# Patient Record
Sex: Male | Born: 1965 | Race: Black or African American | Hispanic: No | Marital: Single | Smoking: Former smoker
Health system: Southern US, Community
[De-identification: ages and names within clinical notes are randomized; demographics above are authoritative.]

## PROBLEM LIST (undated history)

## (undated) DIAGNOSIS — I10 Essential (primary) hypertension: Secondary | ICD-10-CM

## (undated) DIAGNOSIS — E119 Type 2 diabetes mellitus without complications: Secondary | ICD-10-CM

## (undated) DIAGNOSIS — G43909 Migraine, unspecified, not intractable, without status migrainosus: Secondary | ICD-10-CM

## (undated) DIAGNOSIS — F32A Depression, unspecified: Secondary | ICD-10-CM

## (undated) DIAGNOSIS — F329 Major depressive disorder, single episode, unspecified: Secondary | ICD-10-CM

## (undated) DIAGNOSIS — F419 Anxiety disorder, unspecified: Secondary | ICD-10-CM

## (undated) HISTORY — DX: Type 2 diabetes mellitus without complications: E11.9

---

## 1998-03-12 ENCOUNTER — Emergency Department (HOSPITAL_COMMUNITY): Admission: EM | Admit: 1998-03-12 | Discharge: 1998-03-12 | Payer: Self-pay | Admitting: Emergency Medicine

## 1998-12-07 ENCOUNTER — Encounter: Payer: Self-pay | Admitting: Emergency Medicine

## 1998-12-07 ENCOUNTER — Emergency Department (HOSPITAL_COMMUNITY): Admission: EM | Admit: 1998-12-07 | Discharge: 1998-12-07 | Payer: Self-pay | Admitting: Emergency Medicine

## 2001-08-23 ENCOUNTER — Emergency Department (HOSPITAL_COMMUNITY): Admission: EM | Admit: 2001-08-23 | Discharge: 2001-08-23 | Payer: Self-pay | Admitting: Emergency Medicine

## 2002-05-05 ENCOUNTER — Emergency Department (HOSPITAL_COMMUNITY): Admission: EM | Admit: 2002-05-05 | Discharge: 2002-05-05 | Payer: Self-pay | Admitting: Emergency Medicine

## 2002-11-01 ENCOUNTER — Emergency Department (HOSPITAL_COMMUNITY): Admission: EM | Admit: 2002-11-01 | Discharge: 2002-11-01 | Payer: Self-pay | Admitting: Emergency Medicine

## 2002-11-01 ENCOUNTER — Encounter: Payer: Self-pay | Admitting: Emergency Medicine

## 2003-01-15 ENCOUNTER — Emergency Department (HOSPITAL_COMMUNITY): Admission: EM | Admit: 2003-01-15 | Discharge: 2003-01-15 | Payer: Self-pay | Admitting: Emergency Medicine

## 2003-01-21 ENCOUNTER — Emergency Department (HOSPITAL_COMMUNITY): Admission: EM | Admit: 2003-01-21 | Discharge: 2003-01-21 | Payer: Self-pay | Admitting: Emergency Medicine

## 2003-03-07 ENCOUNTER — Emergency Department (HOSPITAL_COMMUNITY): Admission: EM | Admit: 2003-03-07 | Discharge: 2003-03-07 | Payer: Self-pay | Admitting: Emergency Medicine

## 2003-04-26 ENCOUNTER — Emergency Department (HOSPITAL_COMMUNITY): Admission: EM | Admit: 2003-04-26 | Discharge: 2003-04-27 | Payer: Self-pay | Admitting: Emergency Medicine

## 2003-04-27 ENCOUNTER — Encounter: Payer: Self-pay | Admitting: Emergency Medicine

## 2006-03-31 ENCOUNTER — Emergency Department (HOSPITAL_COMMUNITY): Admission: EM | Admit: 2006-03-31 | Discharge: 2006-03-31 | Payer: Self-pay | Admitting: Emergency Medicine

## 2008-12-03 ENCOUNTER — Ambulatory Visit: Payer: Self-pay | Admitting: Family Medicine

## 2008-12-07 ENCOUNTER — Ambulatory Visit (HOSPITAL_COMMUNITY): Admission: RE | Admit: 2008-12-07 | Discharge: 2008-12-07 | Payer: Self-pay | Admitting: Family Medicine

## 2013-10-31 ENCOUNTER — Emergency Department (INDEPENDENT_AMBULATORY_CARE_PROVIDER_SITE_OTHER)
Admission: EM | Admit: 2013-10-31 | Discharge: 2013-10-31 | Disposition: A | Discharge status: Home / Self Care | Payer: 59 | Source: Home / Self Care

## 2013-10-31 ENCOUNTER — Encounter (HOSPITAL_COMMUNITY): Payer: Self-pay | Admitting: Emergency Medicine

## 2013-10-31 ENCOUNTER — Emergency Department (INDEPENDENT_AMBULATORY_CARE_PROVIDER_SITE_OTHER): Payer: 59

## 2013-10-31 DIAGNOSIS — S92919A Unspecified fracture of unspecified toe(s), initial encounter for closed fracture: Secondary | ICD-10-CM

## 2013-10-31 DIAGNOSIS — S92912A Unspecified fracture of left toe(s), initial encounter for closed fracture: Secondary | ICD-10-CM

## 2013-10-31 NOTE — Discharge Instructions (Signed)
Buddy Taping of Toes We have taped your toes together to keep them from moving. This is called "buddy taping" since we used a part of your own body to keep the injured part still. We placed soft padding between your toes to keep them from rubbing against each other. Buddy taping will help with healing and to reduce pain. Keep your toes buddy taped together for as long as directed by your caregiver. HOME CARE INSTRUCTIONS   Raise your injured area above the level of your heart while sitting or lying down. Prop it up with pillows.  An ice pack used every twenty minutes, while awake, for the first one to two days may be helpful. Put ice in a plastic bag and put a towel between the bag and your skin.  Watch for signs that the taping is too tight. These signs may be:  Numbness of your taped toes.  Coolness of your taped toes.  Color change in the area beyond the tape.  Increased pain.  If you have any of these signs, loosen or rewrap the tape. If you need to loosen or rewrap the buddy tape, make sure you use the padding again. SEEK IMMEDIATE MEDICAL CARE IF:   You have worse pain, swelling, inflammation (soreness), drainage or bleeding after you rewrap the tape.  Any new problems occur. MAKE SURE YOU:   Understand these instructions.  Will watch your condition.  Will get help right away if you are not doing well or get worse. Document Released: 05/18/2004 Document Revised: 11/06/2011 Document Reviewed: 08/11/2008 [facility] Patient Information 2014 [place].  Toe Fracture Your caregiver has diagnosed you as having a fractured toe. A toe fracture is a break in the bone of a toe. "Buddy taping" is a way of splinting your broken toe, by taping the broken toe to the toe next to it. This "buddy taping" will keep the injured toe from moving beyond normal range of motion. Buddy taping also helps the toe heal in a more normal alignment. It may take 6 to 8 weeks for the toe injury to  heal. [place] your toes taped together for as long as directed by your caregiver or until you see a doctor for a follow-up examination. You can change the tape after bathing. Always use a small piece of gauze or cotton between the toes when taping them together. This will help the skin stay dry and prevent infection.  Apply ice to the injury for 15-20 minutes each hour while awake for the first 2 days. Put the ice in a plastic bag and place a towel between the bag of ice and your skin.  After the first 2 days, apply heat to the injured area. Use heat for the next 2 to 3 days. Place a heating pad on the foot or soak the foot in warm water as directed by your caregiver.  Keep your foot elevated as much as possible to lessen swelling.  Wear sturdy, supportive shoes. The shoes should not pinch the toes or fit tightly against the toes.  Your caregiver may prescribe a rigid shoe if your foot is very swollen.  Your may be given crutches if the pain is too great and it hurts too much to walk.  Only take over-the-counter or prescription medicines for pain, discomfort, or fever as directed by your caregiver.  If your caregiver has given you a follow-up appointment, it is very important to keep that appointment. Not keeping the appointment  could result in a chronic or permanent injury, pain, and disability. If there is any problem keeping the appointment, you must call back to this facility for assistance. SEEK MEDICAL CARE IF:   You have increased pain or swelling, not relieved with medications.  The pain does not get better after 1 week.  Your injured toe is cold when the others are warm. SEEK IMMEDIATE MEDICAL CARE IF:   The toe becomes cold, numb, or white.  The toe becomes hot (inflamed) and red. Document Released: 08/11/2000 Document Revised: 11/06/2011 Document Reviewed: 03/30/2008 [facility] Patient Information 2014 [place].    Keep toe taped at all  times. Ok to re wrap after bathing. May ice as needed for 20 min. Use Tylenol or Motrin as directed for any discomfort. Wear post op shoe at all times when not in hard sole shoes for 6 weeks.

## 2013-10-31 NOTE — ED Provider Notes (Signed)
CSN: [id number]     Arrival date & time 10/31/13  1836 History   None    Chief Complaint  Patient presents with  . Foot Pain   (Consider location/radiation/quality/duration/timing/severity/associated sxs/prior Treatment) HPI Comments: Patient presents with left foot pain. He reports dropping a muffler on his foot. He had steel toe boots, but the part hit just below this. He has pain just below the 5th toe, and has noted bruising and swelling. Able to bear weight. No numbness or tingling.   Patient is a 48 y.o. male presenting with lower extremity pain. The history is provided by the patient.  Foot Pain    History reviewed. No pertinent past medical history. History reviewed. No pertinent past surgical history. No family history on file. History  Substance Use Topics  . Smoking status: Not on file  . Smokeless tobacco: Not on file  . Alcohol Use: Not on file    Review of Systems  All other systems reviewed and are negative.    Allergies  Review of patient's allergies indicates no known allergies.  Home Medications  No current outpatient prescriptions on file. BP 130/90  Pulse 93  Temp(Src) 97.2 F (36.2 C) (Oral)  Resp 16  SpO2 96% Physical Exam  Nursing note and vitals reviewed. Constitutional: He is oriented to person, place, and time. He appears well-developed and well-nourished. No distress.  HENT:  Head: Normocephalic and atraumatic.  Pulmonary/Chest: Effort normal.  Musculoskeletal: Normal range of motion. He exhibits edema and tenderness.  Slight ecchymosis of the base of the left 5th toe. Pain with palpation along the head of the 5th MTP. Pulses intact, good strength  Neurological: He is alert and oriented to person, place, and time.  Skin: Skin is warm and dry.  Psychiatric: His behavior is normal.    ED Course  Procedures (including critical care time) Labs Review Labs Reviewed - No data to display Imaging Review Dg Foot Complete Left  10/31/2013    CLINICAL DATA:  Dropped heavy object on foot.  Lateral foot pain.  EXAM: LEFT FOOT - COMPLETE 3+ VIEW  COMPARISON:  None.  FINDINGS: There is a mildly displaced fracture through the midshaft of the left fifth proximal phalanx. Slight lateral displacement of the distal fragment. No additional acute bony abnormality. No intra-articular extension.  IMPRESSION: Mildly displaced fracture through the left fifth proximal phalanx midshaft.   Electronically Signed   By: Charlett NoseKevin  Dover M.D.   On: 10/31/2013 20:19     MDM   1. Closed fracture of left toe   Discussed with Dr. Minda DittoKellar. Buddy tape toe with post op wooden shoe x 6 weeks. F/U if worsens.     Riki SheerMichelle G Young, PA-C 10/31/13 2042

## 2013-10-31 NOTE — ED Notes (Signed)
Patient states dropped a muffler on top of his left foot Hit right near his pinky toe

## 2013-10-31 NOTE — ED Provider Notes (Signed)
Medical screening examination/treatment/procedure(s) were performed by non-physician practitioner and as supervising physician I was immediately available for consultation/collaboration.  Leslee Homeavid Aymar Whitfill, M.D.   Reuben Likesavid C Arelis Neumeier, MD 10/31/13 2222

## 2014-02-17 ENCOUNTER — Emergency Department (HOSPITAL_COMMUNITY)
Admission: EM | Admit: 2014-02-17 | Discharge: 2014-02-17 | Disposition: A | Discharge status: Home / Self Care | Payer: Self-pay | Attending: Emergency Medicine | Admitting: Emergency Medicine

## 2014-02-17 ENCOUNTER — Encounter (HOSPITAL_COMMUNITY): Payer: Self-pay | Admitting: Emergency Medicine

## 2014-02-17 DIAGNOSIS — I159 Secondary hypertension, unspecified: Secondary | ICD-10-CM

## 2014-02-17 DIAGNOSIS — Z87891 Personal history of nicotine dependence: Secondary | ICD-10-CM | POA: Insufficient documentation

## 2014-02-17 DIAGNOSIS — R51 Headache: Secondary | ICD-10-CM | POA: Insufficient documentation

## 2014-02-17 DIAGNOSIS — I158 Other secondary hypertension: Secondary | ICD-10-CM | POA: Insufficient documentation

## 2014-02-17 DIAGNOSIS — R519 Headache, unspecified: Secondary | ICD-10-CM

## 2014-02-17 HISTORY — DX: Migraine, unspecified, not intractable, without status migrainosus: G43.909

## 2014-02-17 HISTORY — DX: Essential (primary) hypertension: I10

## 2014-02-17 LAB — BASIC METABOLIC PANEL
BUN: 12 mg/dL (ref 6–23)
CO2: 27 mEq/L (ref 19–32)
Calcium: 9.3 mg/dL (ref 8.4–10.5)
Chloride: 105 mEq/L (ref 96–112)
Creatinine, Ser: 0.84 mg/dL (ref 0.50–1.35)
GFR calc Af Amer: >90 mL/min (ref >90)
Glucose, Bld: 100 mg/dL — ABNORMAL HIGH (ref 70–99)
POTASSIUM: 4.1 meq/L (ref 3.7–5.3)
SODIUM: 145 meq/L (ref 137–147)

## 2014-02-17 LAB — URINALYSIS, ROUTINE W REFLEX MICROSCOPIC
Bilirubin Urine: NEGATIVE
Glucose, UA: NEGATIVE mg/dL
HGB URINE DIPSTICK: NEGATIVE
Ketones, ur: NEGATIVE mg/dL
Leukocytes, UA: NEGATIVE
NITRITE: NEGATIVE
Protein, ur: 30 mg/dL — AB
Specific Gravity, Urine: 1.024 (ref 1.005–1.030)
UROBILINOGEN UA: 1 mg/dL (ref 0.0–1.0)
pH: 6.5 (ref 5.0–8.0)

## 2014-02-17 LAB — URINE MICROSCOPIC-ADD ON

## 2014-02-17 MED ORDER — SODIUM CHLORIDE 0.9 % IV BOLUS (SEPSIS)
1000.0000 mL | Freq: Once | INTRAVENOUS | Status: AC
  Administered 2014-02-17: 1000 mL via INTRAVENOUS

## 2014-02-17 MED ORDER — DIPHENHYDRAMINE HCL 50 MG/ML IJ SOLN
25.0000 mg | Freq: Once | INTRAMUSCULAR | Status: AC
  Administered 2014-02-17: 25 mg via INTRAVENOUS

## 2014-02-17 MED ORDER — METOCLOPRAMIDE HCL 5 MG/ML IJ SOLN
10.0000 mg | Freq: Once | INTRAMUSCULAR | Status: AC
  Administered 2014-02-17: 10 mg via INTRAVENOUS

## 2014-02-17 NOTE — ED Provider Notes (Signed)
CSN: [id number]     Arrival date & time 02/17/14  1846 History   First MD Initiated Contact with Patient 02/17/14 1920     Chief Complaint  Patient presents with  . Hypertension  . Headache     (Consider location/radiation/quality/duration/timing/severity/associated sxs/prior Treatment) Patient is a 48 y.o. male presenting with general illness. The history is provided by the patient and the spouse.  Illness Severity:  Mild Onset quality:  Gradual Timing:  Constant Progression:  Unchanged Chronicity:  New Associated symptoms: headaches   Associated symptoms: no abdominal pain, no chest pain, no congestion, no cough, no diarrhea, no fever, no nausea, no rhinorrhea, no shortness of breath and no vomiting     48 yo male pw hypertension. H/o same. Off meds after making lifestyle changes. Self discontinued. Generalized fatigue past few days. Also with gradual onset mild (2/10 max) posterior headache. Non-radiating. No trauma. No acute visual changes. No numbness/tingling/weakness. No CP or SOB.  Checked home BP today and found it to be 210/150. Took family members atenolol. No change in symptoms. Otherwise been feeling well.   Past Medical History  Diagnosis Date  . Migraines   . Hypertension    History reviewed. No pertinent past surgical history. No family history on file. History  Substance Use Topics  . Smoking status: Former Games developermoker  . Smokeless tobacco: Not on file  . Alcohol Use: No    Review of Systems  Constitutional: Negative for fever and chills.  HENT: Negative for congestion and rhinorrhea.   Eyes: Negative for visual disturbance (wearing glassess past 4 years. ).  Respiratory: Negative for cough and shortness of breath.   Cardiovascular: Negative for chest pain and leg swelling.  Gastrointestinal: Negative for nausea, vomiting, abdominal pain and diarrhea.  Genitourinary: Negative for flank pain and difficulty urinating.  Musculoskeletal: Negative for back pain  and neck stiffness.  Neurological: Positive for light-headedness (mild. resolved) and headaches. Negative for dizziness.  All other systems reviewed and are negative.     Allergies  Review of patient's allergies indicates no known allergies.  Home Medications   Prior to Admission medications   Medication Sig Start Date End Date Taking? Authorizing Provider  aspirin 325 MG tablet Take 650 mg by mouth daily as needed for moderate pain.   Yes Historical Provider, MD  Aspirin-Salicylamide-Caffeine (BC HEADACHE POWDER PO) Take 1 each by mouth 2 (two) times daily as needed (for headache).   Yes Historical Provider, MD  ibuprofen (ADVIL,MOTRIN) 200 MG tablet Take 800 mg by mouth every 6 (six) hours as needed.   Yes Historical Provider, MD  OVER THE COUNTER MEDICATION Take 2 tablets by mouth daily. Hydroxycut   Yes Historical Provider, MD   BP 163/95  Pulse 85  Temp(Src) 98.4 F (36.9 C) (Oral)  Ht 6' (1.829 m)  Wt 231 lb (104.781 kg)  BMI 31.32 kg/m2  SpO2 92% Physical Exam  Nursing note and vitals reviewed. Constitutional: He is oriented to person, place, and time. He appears well-developed and well-nourished. No distress.  Sitting up on side of bed. NAD. Smiling. Speaks in full sentences.  HENT:  Head: Normocephalic and atraumatic.  Eyes: Conjunctivae and EOM are normal. Pupils are equal, round, and reactive to light. Right eye exhibits no discharge. Left eye exhibits no discharge.  Neck: Normal range of motion. Neck supple. No tracheal deviation present.  No bruit  Cardiovascular: Normal rate, regular rhythm, normal heart sounds and intact distal pulses.   Pulmonary/Chest: Effort normal and breath sounds  normal. No stridor. No respiratory distress. He has no wheezes. He has no rales.  Abdominal: Soft. He exhibits no distension. There is no tenderness. There is no guarding.  Musculoskeletal: He exhibits no edema and no tenderness.  Neurological: He is alert and oriented to person,  place, and time. He has normal strength. No cranial nerve deficit or sensory deficit. He displays a negative Romberg sign. Coordination and gait normal. GCS eye subscore is 4. GCS verbal subscore is 5. GCS motor subscore is 6.  Intact finger to nose Normal gait Intact tandem gait No drift  Skin: Skin is warm and dry.  Psychiatric: He has a normal mood and affect. His behavior is normal.    ED Course  Procedures (including critical care time) Labs Review Labs Reviewed  BASIC METABOLIC PANEL - Abnormal; Notable for the following:    Glucose, Bld 100 (*)    All other components within normal limits  URINALYSIS, ROUTINE W REFLEX MICROSCOPIC - Abnormal; Notable for the following:    Protein, ur 30 (*)    All other components within normal limits  URINE MICROSCOPIC-ADD ON    Imaging Review No results found.   EKG Interpretation   Date/Time:  Tuesday February 17 2014 20:10:24 EDT Ventricular Rate:  75 PR Interval:  230 QRS Duration: 103 QT Interval:  406 QTC Calculation: 453 R Axis:   88 Text Interpretation:  Sinus rhythm Prolonged PR interval No old tracing to  compare Confirmed by [facility]  MD, DAVID ([id number]) on 02/17/2014 8:41:24 PM      MDM   Final diagnoses:  Secondary hypertension, unspecified  Acute nonintractable headache, unspecified headache type    HTN and headache. BP improved during ED course and was never >200 systolics. Without evidence of end organ dysfxn. Do not suspect htn emergency. Not cw CVA/TIA. No trauma. No CP. Normal Cr. Headache cocktail with resolution of headache. Will hold on initiating antihypertensives as patient's BP improved during ED course without intervention. Given resource for pcp follow up. No infectious s/s. No meningismus. Mild ha and not acute. Do not suspect SAH.   Patient discharged home. Return precautions given. To follow up with pcp. patient in agreement with plan.  Labs and imaging reviewed by myself and considered in medical decision  making if ordered. Imaging interpreted by radiology.   Discussed case with Dr. Preston FleetingGlick who is in agreement with assessment and plan.      Stevie Kernyan Hoang Pettingill, MD 02/18/14 [phone]

## 2014-02-17 NOTE — ED Notes (Signed)
PT states he had headache and sister checked blood pressure and it was 210/150.  Pt reports weakness all over and tired.  No numbess

## 2014-02-17 NOTE — ED Notes (Signed)
Pt presents w/ HA x4 days, admits to hx of migraines, states he has also been off his blood pressure medication "for a while" and took his BP w/ his sisters machine and his BP was elevated today. Pt reports improvement in headache since being out of the sun. Pt resting comfortably on bed in no acute distress, family at bedside.

## 2014-02-17 NOTE — ED Provider Notes (Signed)
48 year old male had been diagnosed with high blood pressure 7 years ago and was given a one-month course of antihypertensive medication but he never followed up with a physician. He has been monitoring his blood pressure intermittently since then and had been doing generally well until 3 days ago when he noted a blood pressure of 210/150. Today, he started having a mild occipital headache and recheck his blood pressure was in the same range so he came in to be evaluated. On exam, he is resting comfortably and in no acute distress. Blood pressure in the ED is only moderately elevated at 165/95. His fundi show no hemorrhage, exudate, or papilledema. Neck is supple with mild tenderness to palpation in the insertion of the paracervical muscles bilaterally. Lungs are clear heart is regular rate and rhythm. Extremities have no cyanosis or edema. Neurologic exam is normal. He probably does have mild hypertension but I see no evidence of hypertensive urgency. He will be given headache cocktail for his headache and screening labs to be obtained to evaluate for evidence of endorgan damage. You will need to have his blood pressure monitored as an outpatient on a more regular basis.  I saw and evaluated the patient, reviewed the resident's note and I agree with the findings and plan.   EKG Interpretation   Date/Time:  Tuesday February 17 2014 20:10:24 EDT Ventricular Rate:  75 PR Interval:  230 QRS Duration: 103 QT Interval:  406 QTC Calculation: 453 R Axis:   88 Text Interpretation:  Sinus rhythm Prolonged PR interval No old tracing to  compare Confirmed by [facility]  MD, DAVID ([id number]) on 02/17/2014 8:41:24 PM        Dione Boozeavid Glick, MD 02/17/14 2045

## 2014-02-17 NOTE — Discharge Instructions (Signed)
°Emergency Department Resource Guide °1) Find a Doctor and Pay Out of Pocket °Although you won't have to find out who is covered by your insurance plan, it is a good idea to ask around and get recommendations. You will then need to call the office and see if the doctor you have chosen will accept you as a new patient and what types of options they offer for patients who are self-pay. Some doctors offer discounts or will set up payment plans for their patients who do not have insurance, but you will need to ask so you aren't surprised when you get to your appointment. ° °2) Contact Your Local Health Department °Not all health departments have doctors that can see patients for sick visits, but many do, so it is worth a call to see if yours does. If you don't know where your local health department is, you can check in your phone book. The CDC also has a tool to help you locate your state's health department, and many state websites also have listings of all of their local health departments. ° °3) Find a Walk-in Clinic °If your illness is not likely to be very severe or complicated, you may want to try a walk in clinic. These are popping up all over the country in pharmacies, drugstores, and shopping centers. They're usually staffed by nurse practitioners or physician assistants that have been trained to treat common illnesses and complaints. They're usually fairly quick and inexpensive. However, if you have serious medical issues or chronic medical problems, these are probably not your best option. ° °No Primary Care Doctor: °- Call [facility] at  832-8000 - they can help you locate a primary care doctor that  accepts your insurance, provides certain services, etc. °- Physician Referral Service- 1-800-533-3463 ° °Chronic Pain Problems: °Organization         Address  Phone   Notes  °[facility]  (336) 297-2271 Patients need to be referred by their primary care doctor.  ° °Medication  Assistance: °Organization         Address  Phone   Notes  °[place] [facility] [address] °[place], [place] [address] (336) 641-8030 --Must be a resident of [place] °-- Must have NO insurance coverage whatsoever (no Medicaid/ Medicare, etc.) °-- The pt. MUST have a primary care doctor that directs their care regularly and follows them in the community °  °[facility]  (866) 331-1348   °[employer]  (888) 892-1162   ° °Agencies that provide inexpensive medical care: °Organization         Address  Phone   Notes  °[facility]  (336) 832-8035   ° Internal Medicine    (336) 832-7272   °[facility] [address] °[place], [place] [address] (336) 832-4777   °[facility] [address], °[place] (336) 271-4999   °[facility]    (336) 373-0678   °[facility]    (336) 272-1050   °[facility] ° [address], [place] Phone:  (336) 832-4444, Fax:  (336) 832-4440 Hours of Operation:  9 am - 6 pm, M-F.  Also accepts Medicaid/Medicare and self-pay.  °[facility] ° [address], [place] Phone: (336) 832-3150, Fax: (336) 832-3151. Hours of Operation:  8:30 am - 5:30 pm, M-F.  Also accepts Medicaid and self-pay.  °[facility] [place] [address], [place] Phone: (336) 878-6027   °[facility] [address], [place], [place] (336)723-1848, Ext. 123 Mondays & Thursdays: 7-9 AM.  First 15 patients are seen on a first come, first serve basis. °  ° °Medicaid-accepting [place] Providers: ° °Organization         Address  Phone   Notes  °[facility] [address], [place] (336) 641-2100 Also accepts self-pay patients.  °[facility] [address], [place] ° (336) 856-9996   °[facility] [address], [place]  (336) 288-8857   °[facility] [address] [place] [address], [place] (336) 299-7000   °Veita Bland [address], [place]  ° (336) 373-1557 Only accepts [place] Access Medicaid patients after they have their name applied to their card.  ° °Self-Pay (no insurance) in [place]: ° °Organization         Address  Phone   Notes  °Sickle Cell Patients, [facility] [address], [place] (336) 832-1970   °[facility] [address], [place] (336) 832-4400   °[facility] ° [address], [place] (336) 992-4800   °[facility]/Dr. Osei-Bonsu ° [address] [place] [address], [place] or [address], [place] (336) 841-8500 Phone number for both [place] and [place] locations is the same.  °[facility] [address], [place] (336) 299-0000   °[facility] [address] [place] [address], [place] or [address] (336) 852-7530 °(336) 878-2260   °[facility] [address], [place] (336) 350-1642, phone; (336) 294-5005, fax Sees patients 1st and 3rd Saturday of every month.  Must not qualify for public or private insurance (i.e. Medicaid, Medicare, [place] Health Choice, Veterans' Benefits) • Household income should be no more than 200% of the poverty level •The clinic cannot treat you if you are pregnant or think you are pregnant • Sexually transmitted diseases are not treated at the clinic.  ° ° °Dental Care: °Organization         Address  Phone  Notes  °[place] [facility] [address], [place] (336) 641-6152 Accepts children up to age 21 who are enrolled in Medicaid or [place] Health Choice; pregnant women with a Medicaid card; and children who have applied for Medicaid or [place] Health Choice, but were declined, whose parents can pay a reduced fee at time of service.  °[place]  [facility] [place]  [address], [place] (336) 641-7733 Accepts children up to age 21 who are enrolled in Medicaid or [place] Health Choice; pregnant women with a Medicaid card; and children who have applied for Medicaid or [place] Health Choice, but were declined, whose parents can pay a reduced fee at time of service.  °[facility] ° [address],  (336) 641-4533 Patients are seen by appointment only. Walk-ins are not accepted. [facility] will see patients 18 years of age and older. °Monday - Tuesday (8am-5pm) °Most Wednesdays (8:30-5pm) °$30 per visit, cash only  °[facility] ° [address], [place] (336) 641-4533 Patients are seen by appointment only. Walk-ins are not accepted. [facility] will see patients 18 years of age and older. °One   Wednesday Evening (Monthly: Volunteer Based).  $30 per visit, cash only  °[facility]  (919) 537-3737 for adults; Children under age 4, call Graduate Pediatric Dentistry at (919) [phone]. Children aged 4-14, please call (919) [phone] to request a pediatric application. ° Dental services are provided in all areas of dental care including fillings, crowns and bridges, complete and partial dentures, implants, gum treatment, root canals, and extractions. Preventive care is also provided. Treatment is provided to both adults and children. °Patients are selected via a lottery and there is often a waiting list. °  °[facility] [address], °[place] ° (336) 763-8833 [email or web address] °  °[facility] [address], [place], [place] (336)723-1848, Ext. 123 Second and Fourth Thursday of each month, opens at 6:30 AM; Clinic ends at 9 AM.  Patients are seen on a first-come first-served basis, and a limited number are seen during each clinic.  ° °[facility] ° [address], [place], [place] (336) 723-7904    Eligibility Requirements °You must have lived in [place], [place], or [place] counties for at least the last three months. °  You cannot be eligible for state or federal sponsored healthcare insurance, including Veterans Administration, Medicaid, or Medicare. °  You generally cannot be eligible for healthcare insurance through your employer.  °  How to apply: °Eligibility screenings are held every Tuesday and Wednesday afternoon from 1:00 pm until 4:00 pm. You do not need an appointment for the interview!  °[facility] [address], [place], [place] 336-631-2330   °[facility]  336-342-8273   °[facility]  336-703-3100   °[facility]  336-570-6415   ° °Behavioral Health Resources in the Community: °Intensive Outpatient Programs °Organization         Address  Phone  Notes  °[facility] [address], [place], [place] 336-878-6098   °[facility] [address], [place], [place] 336-832-9800   °[facility] [address], [place], [place] ° 336-882-2125   °[facility] [address],  °[place], [place] 1-800-853-5163 or 336-641-4981   °Substance Abuse Resources °Organization         Address  Phone  Notes  °[facility]  336-882-2125   °[facility]  336-784-9470   °[facility]  336-285-9073   °[facility]  336-845-3988   °Residential & Outpatient Substance Abuse Program  1-800-659-3381   °Psychological Services °Organization         Address  Phone  Notes  °[facility]  336- 832-9600   °[facility]  336- 378-7881   °[facility] [address], [place] 1-800-853-5163 or 336-641-4981   ° °Mobile Crisis Teams °Organization         Address  Phone  Notes  °[facility], Mobile Crisis Care Unit  1-877-626-1772   °Assertive °[facility] ° [address]  [place], [place] 336-834-9664   °Sharon DeEsch [address] °[place] 336-554-5454   ° °Self-Help/Support Groups °Organization         Address  Phone             Notes  °[facility] [place] - variety of support groups  336- 373-1402 Call for more information  °Narcotics Anonymous (NA), [facility] [address], °[place]  2 meetings at this location  ° °  Residential Treatment Programs °Organization         Address  Phone  Notes  °[facility] [address],    °[place]  1-866-801-8205   °[facility] ° [address], [place], [place] 704-293-8524   °[facility] [address], [place] 336-845-3988 Admissions: 8am-3pm M-F  °[facility] [address]    °[place], [place] 336-841-1104   °[facility] [address], [place], [place] 336-379-7146   °[facility] [address]  °[place], [place] 336-285-9073   °[facility] - Intensive Outpatient [address], [place], [place] 336-852-3033   °[facility] [address]  °[place], [place] 1-877-615-2722 or 336-784-9470   °[facility] [address] [place], [place] 336-227-7417 Accepts Medicaid  °[facility] [address]  ° [place] 1-800-659-3381 Substance Abuse/Addiction Treatment  ° °[place] Behavioral Health Resources °Organization         Address  Phone  Notes  °[facility]  (888) 581-9988   °Julie Brannon, PhD [address] [place], [place]   (336) 349-5553 or (336) 951-0000   °[facility]   [address] °[place], [place] (336) 349-4454   °[facility] [address], [place], [place] (336) 342-8316 Insurance/Medicaid/sponsorship through [facility]  °[facility] [address]                                    [place], [place] (336) 342-8316 Therapy/tele-psych/case    °[facility] [address]  ° [place], [place] (336) 349-2233    °Dr. Arfeen  (336) 349-4544   °[facility] [place]  [employer] [place] [facility] 1) [address], [place] °2) [address], [place] °3)  [address], [place] (336) 349-3220 °(336) 342-7768 ° °(336) 342-8140   °[place] [facility] (336) 342-1394 or (336) 342-3537 (After Hours)    ° ° °

## 2014-02-17 NOTE — ED Notes (Signed)
Pt ambulating independently w/ steady gait on d/c in no acute distress, A&Ox4.D/c instructions reviewed w/ pt and family - pt and family deny any further questions or concerns at present.  

## 2014-02-17 NOTE — ED Notes (Signed)
Pt did take one of his sister's blood pressure pills today

## 2016-02-19 ENCOUNTER — Emergency Department (HOSPITAL_COMMUNITY)
Admission: EM | Admit: 2016-02-19 | Discharge: 2016-02-19 | Disposition: A | Discharge status: Home / Self Care | Payer: BLUE CROSS/BLUE SHIELD | Attending: Emergency Medicine | Admitting: Emergency Medicine

## 2016-02-19 ENCOUNTER — Encounter (HOSPITAL_COMMUNITY): Payer: Self-pay | Admitting: Emergency Medicine

## 2016-02-19 DIAGNOSIS — I10 Essential (primary) hypertension: Secondary | ICD-10-CM | POA: Diagnosis not present

## 2016-02-19 DIAGNOSIS — H109 Unspecified conjunctivitis: Secondary | ICD-10-CM | POA: Insufficient documentation

## 2016-02-19 DIAGNOSIS — Z79899 Other long term (current) drug therapy: Secondary | ICD-10-CM | POA: Diagnosis not present

## 2016-02-19 DIAGNOSIS — H5711 Ocular pain, right eye: Secondary | ICD-10-CM | POA: Diagnosis present

## 2016-02-19 DIAGNOSIS — Z87891 Personal history of nicotine dependence: Secondary | ICD-10-CM | POA: Insufficient documentation

## 2016-02-19 MED ORDER — POLYMYXIN B-TRIMETHOPRIM 10000-0.1 UNIT/ML-% OP SOLN
1.0000 [drp] | OPHTHALMIC | Status: DC
  Administered 2016-02-19: 1 [drp] via OPHTHALMIC
  Filled 2016-02-19: qty 10

## 2016-02-19 MED ORDER — FLUORESCEIN SODIUM 1 MG OP STRP
1.0000 | ORAL_STRIP | Freq: Once | OPHTHALMIC | Status: AC
  Administered 2016-02-19: 1 via OPHTHALMIC
  Filled 2016-02-19: qty 1

## 2016-02-19 MED ORDER — TETRACAINE HCL 0.5 % OP SOLN
1.0000 [drp] | Freq: Once | OPHTHALMIC | Status: AC
  Administered 2016-02-19: 1 [drp] via OPHTHALMIC
  Filled 2016-02-19: qty 4

## 2016-02-19 NOTE — ED Notes (Signed)
Pt states he went to urgent care Monday and was diagnosed with conjunctivitis. Pt states he has been taking eye drops and rubbing his eyelid constantly. Feels like something is in his eye or he scratched it, but eye drops are not giving him any relief.

## 2016-02-19 NOTE — Discharge Instructions (Signed)
How to Use Eye Drops and Eye Ointments  HOW TO APPLY EYE DROPS  Follow these steps when applying eye drops:  1. Wash your hands.  2. Tilt your head back.  3. Put a finger under your eye and use it to gently pull your lower lid downward. Keep that finger in place.  4. Using your other hand, hold the dropper between your thumb and index finger.  5. Position the dropper just over the edge of the lower lid. Hold it as close to your eye as you can without touching the dropper to your eye.  6. Steady your hand. One way to do this is to lean your index finger against your brow.  7. Look up.  8. Slowly and gently squeeze one drop of medicine into your eye.  9. Close your eye.  10. Place a finger between your lower eyelid and your nose. Press gently for 2 minutes. This increases the amount of time that the medicine is exposed to the eye. It also reduces side effects that can develop if the drop gets into the bloodstream through the nose.  HOW TO APPLY EYE OINTMENTS  Follow these steps when applying eye ointments:  1. Wash your hands.  2. Put a finger under your eye and use it to gently pull your lower lid downward. Keep that finger in place.  3. Using your other hand, place the tip of the tube between your thumb and index finger with the remaining fingers braced against your cheek or nose.  4. Hold the tube just over the edge of your lower lid without touching the tube to your lid or eyeball.  5. Look up.  6. Line the inner part of your lower lid with ointment.  7. Gently pull up on your upper lid and look down. This will force the ointment to spread over the surface of the eye.  8. Release the upper lid.  9. If you can, close your eyes for 1-2 minutes.  Do not rub your eyes. If you applied the ointment correctly, your vision will be blurry for a few minutes. This is normal.  ADDITIONAL INFORMATION   Make sure to use the eye drops or ointment as told by your health care provider.   If you have been told to use both eye  drops and an eye ointment, apply the eye drops first, then wait 3-4 minutes before you apply the ointment.   Try not to touch the tip of the dropper or tube to your eye. A dropper or tube that has touched the eye can become contaminated.     This information is not intended to replace advice given to you by your health care provider. Make sure you discuss any questions you have with your health care provider.     Document Released: 11/20/2000 Document Revised: 12/29/2014 Document Reviewed: 08/10/2014  Elsevier Interactive Patient Education 2016 Elsevier Inc[employer]

## 2016-02-19 NOTE — ED Provider Notes (Signed)
CSN: [id number]     Arrival date & time 02/19/16  1704 History  By signing my name below, I, Casey Garrett, attest that this documentation has been prepared under the direction and in the presence of Earley FavorGail Christasia Angeletti, NP. Electronically Signed: Ronney LionSuzanne Garrett, ED Scribe. 02/19/2016. 5:44 PM.    Chief Complaint  Patient presents with  . Eye Pain   The history is provided by the patient. No language interpreter was used.    HPI Comments: Casey LefevreKeith D Garrett is a 50 y.o. male with a history of hypertension and migraines, who presents to the Emergency Department complaining of right eye pain and redness that began 8 days ago. He states about 1 day afterwards, his friend had advised him to put coconut oil in his eye, which he tried and which seemed to alleviate his symptoms for several days. However, patient had gone to an UC clinic about 5 days ago just in case, and he was diagnosed with conjunctivitis. Patient states he was also told at that time that he might have a scratch in his eye. Patient was diagnosed with eyedrops,initially got better and stopped the drops for a few days than 2 days ago again had symptoms but not drops not helping. Woke this morning with crusted lids and lid swelling.  He states eye movement do not exacerbates his symptoms.  Patient states he is UTD on his tetanus vaccination. Patient denies any visual disturbances.   Past Medical History  Diagnosis Date  . Migraines   . Hypertension    History reviewed. No pertinent past surgical history. No family history on file. Social History  Substance Use Topics  . Smoking status: Former Games developermoker  . Smokeless tobacco: None  . Alcohol Use: No    Review of Systems  Eyes: Positive for pain, discharge, redness and itching. Negative for photophobia and visual disturbance.  All other systems reviewed and are negative.   Allergies  Review of patient's allergies indicates no known allergies.  Home Medications   Prior to Admission medications    Medication Sig Start Date End Date Taking? Authorizing Provider  ATORVASTATIN CALCIUM PO Take by mouth daily.   Yes Historical Provider, MD  LOSARTAN POTASSIUM PO Take by mouth.   Yes Historical Provider, MD  OMEPRAZOLE PO Take by mouth. As directed.   Yes Historical Provider, MD  trimethoprim-polymyxin b (POLYTRIM) ophthalmic solution 1 drop every 6 (six) hours. To affected eye.   Yes Historical Provider, MD   BP 156/99 mmHg  Pulse 88  Temp(Src) 98.6 F (37 C) (Oral)  Resp 18  SpO2 95% Physical Exam  Constitutional: He is oriented to person, place, and time. He appears well-developed and well-nourished. No distress.  HENT:  Head: Normocephalic and atraumatic.  Eyes: EOM are normal. Pupils are equal, round, and reactive to light. Right eye exhibits discharge. Right conjunctiva is injected.  Neck: Neck supple. No tracheal deviation present.  Cardiovascular: Normal rate.   Pulmonary/Chest: Effort normal. No respiratory distress.  Musculoskeletal: Normal range of motion.  Neurological: He is alert and oriented to person, place, and time.  Skin: Skin is warm and dry.  Psychiatric: He has a normal mood and affect. His behavior is normal.  Nursing note and vitals reviewed.   ED Course  Procedures (including critical care time)  DIAGNOSTIC STUDIES: Oxygen Saturation is 95% on RA, normal by my interpretation.    COORDINATION OF CARE: 5:44 PM - Discussed treatment plan with pt at bedside which includes fluorescein eye exam. Pt verbalized  understanding and agreed to plan.  Check the pharmacy.  He was prescribed Polytrim he will again be prescribed Polytrim.  He will be given the drops in the emergency department with instructions to use 1 drop to his right eye every 4 hours for the next 5 days and follow-up with his ophthalmologist  MDM   Final diagnoses:  Conjunctivitis of right eye     I personally performed the services described in this documentation, which was scribed in my  presence. The recorded information has been reviewed and is accurate.   Earley FavorGail Rayen Palen, NP 02/19/16 1827  Lyndal Pulleyaniel Knott, MD 02/21/16 [phone]

## 2016-02-21 ENCOUNTER — Emergency Department (HOSPITAL_COMMUNITY)
Admission: EM | Admit: 2016-02-21 | Discharge: 2016-02-21 | Disposition: A | Discharge status: Home / Self Care | Payer: BLUE CROSS/BLUE SHIELD

## 2016-07-11 ENCOUNTER — Telehealth: Payer: Self-pay | Admitting: *Deleted

## 2016-07-11 ENCOUNTER — Ambulatory Visit: Payer: BLUE CROSS/BLUE SHIELD | Admitting: Neurology

## 2016-07-11 NOTE — Telephone Encounter (Signed)
No showedpatient new  appt

## 2016-07-12 ENCOUNTER — Encounter: Payer: Self-pay | Admitting: Neurology

## 2017-02-12 ENCOUNTER — Encounter (HOSPITAL_COMMUNITY): Payer: Self-pay | Admitting: Emergency Medicine

## 2017-02-12 ENCOUNTER — Emergency Department (HOSPITAL_COMMUNITY)
Admission: EM | Admit: 2017-02-12 | Discharge: 2017-02-12 | Disposition: A | Discharge status: Home / Self Care | Payer: BLUE CROSS/BLUE SHIELD | Attending: Emergency Medicine | Admitting: Emergency Medicine

## 2017-02-12 DIAGNOSIS — Y939 Activity, unspecified: Secondary | ICD-10-CM | POA: Insufficient documentation

## 2017-02-12 DIAGNOSIS — Z79899 Other long term (current) drug therapy: Secondary | ICD-10-CM | POA: Insufficient documentation

## 2017-02-12 DIAGNOSIS — T23231A Burn of second degree of multiple right fingers (nail), not including thumb, initial encounter: Secondary | ICD-10-CM

## 2017-02-12 DIAGNOSIS — X18XXXA Contact with other hot metals, initial encounter: Secondary | ICD-10-CM | POA: Insufficient documentation

## 2017-02-12 DIAGNOSIS — Y929 Unspecified place or not applicable: Secondary | ICD-10-CM | POA: Insufficient documentation

## 2017-02-12 DIAGNOSIS — Y999 Unspecified external cause status: Secondary | ICD-10-CM | POA: Insufficient documentation

## 2017-02-12 DIAGNOSIS — Z87891 Personal history of nicotine dependence: Secondary | ICD-10-CM | POA: Insufficient documentation

## 2017-02-12 DIAGNOSIS — T23239A Burn of second degree of unspecified multiple fingers (nail), not including thumb, initial encounter: Secondary | ICD-10-CM | POA: Insufficient documentation

## 2017-02-12 DIAGNOSIS — I10 Essential (primary) hypertension: Secondary | ICD-10-CM | POA: Insufficient documentation

## 2017-02-12 HISTORY — DX: Depression, unspecified: F32.A

## 2017-02-12 HISTORY — DX: Major depressive disorder, single episode, unspecified: F32.9

## 2017-02-12 HISTORY — DX: Anxiety disorder, unspecified: F41.9

## 2017-02-12 MED ORDER — LOSARTAN POTASSIUM 50 MG PO TABS
50.0000 mg | ORAL_TABLET | Freq: Every day | ORAL | 0 refills | Status: DC
Start: 2017-02-12 — End: 2018-06-13

## 2017-02-12 MED ORDER — HYDROCODONE-ACETAMINOPHEN 5-325 MG PO TABS
1.0000 | ORAL_TABLET | Freq: Four times a day (QID) | ORAL | 0 refills | Status: DC | PRN
Start: 2017-02-12 — End: 2021-01-20

## 2017-02-12 MED ORDER — IBUPROFEN 800 MG PO TABS
800.0000 mg | ORAL_TABLET | Freq: Once | ORAL | Status: AC
  Administered 2017-02-12: 800 mg via ORAL
  Filled 2017-02-12: qty 1

## 2017-02-12 NOTE — ED Notes (Signed)
Registration remains at bedside.

## 2017-02-12 NOTE — Discharge Instructions (Signed)
As discussed, please stay well hydrated and repeat urine clear. Keep the area clean and dry and use cool compresses and ibuprofen for pain and Vicodin only as needed for breakthrough pain.  Follow-up with your primary care provider in the next few days.  Return to the emergency department if you experience any worsening symptoms or new concerning symptoms in the meantime.
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
## 2018-06-13 ENCOUNTER — Other Ambulatory Visit: Payer: Self-pay

## 2018-06-13 ENCOUNTER — Encounter (HOSPITAL_COMMUNITY): Payer: Self-pay

## 2018-06-13 ENCOUNTER — Emergency Department (HOSPITAL_COMMUNITY)
Admission: EM | Admit: 2018-06-13 | Discharge: 2018-06-13 | Disposition: A | Discharge status: Home / Self Care | Payer: BLUE CROSS/BLUE SHIELD | Attending: Emergency Medicine | Admitting: Emergency Medicine

## 2018-06-13 DIAGNOSIS — I10 Essential (primary) hypertension: Secondary | ICD-10-CM | POA: Insufficient documentation

## 2018-06-13 DIAGNOSIS — I160 Hypertensive urgency: Secondary | ICD-10-CM | POA: Insufficient documentation

## 2018-06-13 DIAGNOSIS — Z87891 Personal history of nicotine dependence: Secondary | ICD-10-CM | POA: Insufficient documentation

## 2018-06-13 DIAGNOSIS — Z79899 Other long term (current) drug therapy: Secondary | ICD-10-CM | POA: Insufficient documentation

## 2018-06-13 LAB — URINALYSIS, ROUTINE W REFLEX MICROSCOPIC
Bilirubin Urine: NEGATIVE
Glucose, UA: NEGATIVE mg/dL
HGB URINE DIPSTICK: NEGATIVE
Ketones, ur: NEGATIVE mg/dL
Leukocytes, UA: NEGATIVE
Nitrite: NEGATIVE
Protein, ur: 100 mg/dL — AB
Specific Gravity, Urine: 1.017 (ref 1.005–1.030)
pH: 7 (ref 5.0–8.0)

## 2018-06-13 LAB — I-STAT CHEM 8, ED
BUN: 10 mg/dL (ref 6–20)
CHLORIDE: 101 mmol/L (ref 98–111)
Calcium, Ion: 1.13 mmol/L — ABNORMAL LOW (ref 1.15–1.40)
Creatinine, Ser: 0.8 mg/dL (ref 0.61–1.24)
Glucose, Bld: 210 mg/dL — ABNORMAL HIGH (ref 70–99)
HCT: 46 % (ref 39.0–52.0)
Hemoglobin: 15.6 g/dL (ref 13.0–17.0)
POTASSIUM: 4 mmol/L (ref 3.5–5.1)
Sodium: 139 mmol/L (ref 135–145)
TCO2: 29 mmol/L (ref 22–32)

## 2018-06-13 MED ORDER — LOSARTAN POTASSIUM 50 MG PO TABS
50.0000 mg | ORAL_TABLET | Freq: Once | ORAL | Status: AC
  Administered 2018-06-13: 50 mg via ORAL
  Filled 2018-06-13: qty 1

## 2018-06-13 MED ORDER — LOSARTAN POTASSIUM 50 MG PO TABS: 50.0000 mg | ORAL_TABLET | Freq: Every day | ORAL | 0 refills | Status: DC

## 2018-06-13 NOTE — Discharge Instructions (Signed)
As discussed, it is important that you take all your medication as prescribed. Please be sure to follow-up with your physician, or return here for any concerning changes.

## 2018-06-13 NOTE — ED Triage Notes (Signed)
Patient reports that he went to [employer] today for his job and BP was elevated/210/150. Patient states he has been off of his BP med due to lack of insurance x 3 months. BP in triage 199/129.

## 2018-06-13 NOTE — ED Provider Notes (Signed)
[facility]-EMERGENCY DEPT Provider Note   CSN: [id number] Arrival date & time: 06/13/18  4098     History   Chief Complaint Chief Complaint  Patient presents with  . Hypertension    HPI Casey Garrett is a 52 y.o. male.  HPI Presents with concern for headache, fatigue.  Headache is sore, diffuse. Patient has history of hypertension, stopped taking his medicine about 3 months ago. Seems as though for some time the patient has had headaches, fatigue, without focal weakness or pain. Today, on planned laboratory evaluation, he was found to have hypertension, and sent here for evaluation.  He denies focal weakness, confusion, syncope, denies other medication change, diet change, activity change.   Past Medical History:  Diagnosis Date  . Anxiety   . Depression   . Hypertension   . Migraines     There are no active problems to display for this patient.   History reviewed. No pertinent surgical history.      Home Medications    Prior to Admission medications   Medication Sig Start Date End Date Taking? Authorizing Provider  ATORVASTATIN CALCIUM PO Take by mouth daily.    [provider]  HYDROcodone-acetaminophen (NORCO/VICODIN) 5-325 MG tablet Take 1 tablet by mouth every 6 (six) hours as needed. Patient not taking: Reported on 06/13/2018 02/12/17   Mathews Robinsons B, PA-C  losartan (COZAAR) 50 MG tablet Take 1 tablet (50 mg total) by mouth daily. Patient not taking: Reported on 06/13/2018 02/12/17 03/14/17  Mathews Robinsons B, PA-C  OMEPRAZOLE PO Take by mouth. As directed.    [provider]  trimethoprim-polymyxin b (POLYTRIM) ophthalmic solution 1 drop every 6 (six) hours. To affected eye.    [provider]    Family History Family History  Problem Relation Age of Onset  . Hypertension Mother     Social History Social History   Tobacco Use  . Smoking status: Former Games developer  . Smokeless tobacco: Never  Used  Substance Use Topics  . Alcohol use: Yes    Comment: "sometimes"  . Drug use: Yes    Types: Marijuana     Allergies   Patient has no known allergies.   Review of Systems Review of Systems  Constitutional:       Per HPI, otherwise negative  HENT:       Per HPI, otherwise negative  Respiratory:       Per HPI, otherwise negative  Cardiovascular:       Per HPI, otherwise negative  Gastrointestinal: Negative for vomiting.  Endocrine:       Negative aside from HPI  Genitourinary:       Neg aside from HPI   Musculoskeletal:       Per HPI, otherwise negative  Skin: Negative.   Neurological: Positive for headaches. Negative for syncope.     Physical Exam Updated Vital Signs BP (!) 182/116   Pulse 78   Temp 98.9 F (37.2 C) (Oral)   Resp 16   Ht 6' (1.829 m)   Wt 113.9 kg   SpO2 96%   BMI 34.04 kg/m   Physical Exam  Constitutional: He is oriented to person, place, and time. He appears well-developed. No distress.  HENT:  Head: Normocephalic and atraumatic.  Eyes: Conjunctivae and EOM are normal.  Cardiovascular: Normal rate and regular rhythm.  Pulmonary/Chest: Effort normal. No stridor. No respiratory distress.  Abdominal: He exhibits no distension.  Musculoskeletal: He exhibits no edema.  Neurological: He is  alert and oriented to person, place, and time. No cranial nerve deficit. He exhibits normal muscle tone.  Skin: Skin is warm and dry.  Psychiatric: He has a normal mood and affect.  Nursing note and vitals reviewed.    ED Treatments / Results  Labs (all labs ordered are listed, but only abnormal results are displayed) Labs Reviewed  URINALYSIS, ROUTINE W REFLEX MICROSCOPIC - Abnormal; Notable for the following components:      Result Value   Protein, ur 100 (*)    Bacteria, UA RARE (*)    All other components within normal limits  I-STAT CHEM 8, ED - Abnormal; Notable for the following components:   Glucose, Bld 210 (*)    Calcium, Ion 1.13  (*)    All other components within normal limits    EKG None  Radiology No results found.  Procedures Procedures (including critical care time)  Medications Ordered in ED Medications  losartan (COZAAR) tablet 50 mg (50 mg Oral Given 06/13/18 1110)     Initial Impression / Assessment and Plan / ED Course  I have reviewed the triage vital signs and the nursing notes.  Pertinent labs & imaging results that were available during my care of the patient were reviewed by me and considered in my medical decision making (see chart for details).  Patient on a hypertensive on arrival, systolic greater than 200. Patient provided his home medication. 12:18 PM Patient awake and alert. He continues to have slight elevation in blood pressure, but just received his oral medication tablet. Labs, urinalysis reassuring, no evidence for endorgan effects.  With a lengthy conversation about the need to continue his recently represcribed medication, follow-up with primary care next week, and monitor your condition. He understands the importance of following up for any concerning changes, otherwise will follow-up with primary care. Absent complaints, with resolution of his headache, and with reinitiation of his blood pressure medication, no indication for additional lab studies, patient discharged in stable condition.  Final Clinical Impressions(s) / ED Diagnoses  Hypertensive urgency   Gerhard Munch, MD 06/13/18 1219

## 2018-07-13 ENCOUNTER — Encounter (HOSPITAL_COMMUNITY): Payer: Self-pay

## 2018-07-13 ENCOUNTER — Other Ambulatory Visit: Payer: Self-pay

## 2018-07-13 ENCOUNTER — Emergency Department (HOSPITAL_COMMUNITY)
Admission: EM | Admit: 2018-07-13 | Discharge: 2018-07-13 | Disposition: A | Discharge status: Home / Self Care | Payer: Self-pay | Attending: Emergency Medicine | Admitting: Emergency Medicine

## 2018-07-13 DIAGNOSIS — F1290 Cannabis use, unspecified, uncomplicated: Secondary | ICD-10-CM | POA: Insufficient documentation

## 2018-07-13 DIAGNOSIS — I10 Essential (primary) hypertension: Secondary | ICD-10-CM | POA: Insufficient documentation

## 2018-07-13 DIAGNOSIS — Z87891 Personal history of nicotine dependence: Secondary | ICD-10-CM | POA: Insufficient documentation

## 2018-07-13 DIAGNOSIS — R040 Epistaxis: Secondary | ICD-10-CM | POA: Insufficient documentation

## 2018-07-13 DIAGNOSIS — Z79899 Other long term (current) drug therapy: Secondary | ICD-10-CM | POA: Insufficient documentation

## 2018-07-13 MED ORDER — AMLODIPINE BESYLATE 5 MG PO TABS: 5.0000 mg | ORAL_TABLET | Freq: Every day | ORAL | 1 refills | Status: DC

## 2018-07-13 MED ORDER — OXYMETAZOLINE HCL 0.05 % NA SOLN
1.0000 | Freq: Once | NASAL | Status: AC
  Administered 2018-07-13: 1 via NASAL
  Filled 2018-07-13: qty 15

## 2018-07-13 MED ORDER — FLUTICASONE PROPIONATE 50 MCG/ACT NA SUSP: 1.0000 | Freq: Every day | NASAL | 0 refills | Status: DC

## 2018-07-13 MED ORDER — LOSARTAN POTASSIUM 100 MG PO TABS: 100.0000 mg | ORAL_TABLET | Freq: Every day | ORAL | 1 refills | Status: DC

## 2018-07-13 NOTE — ED Provider Notes (Signed)
[facility]-EMERGENCY DEPT Provider Note   CSN: [id number] Arrival date & time: 07/13/18  [id number]     History   Chief Complaint Chief Complaint  Patient presents with  . Epistaxis  . Hypertension    HPI Casey Garrett is a 52 y.o. male.  HPI   52 year old male with epistaxis.  Began about 2 days ago.  Intermittent.  All amount of blood coming from his left nose.  He reports that he thinks he has a polyp there.  He feels like there is something flapping when he inhales and exhales.  This is been going on for years.  He is also concerned about his blood pressure.  He notes that it runs very high.  He is prescribed Cozaar from the emergency room reports compliance.  He does not have a PCP.  He has no other acute complaints.  Past Medical History:  Diagnosis Date  . Anxiety   . Depression   . Hypertension   . Migraines     There are no active problems to display for this patient.   History reviewed. No pertinent surgical history.      Home Medications    Prior to Admission medications   Medication Sig Start Date End Date Taking? Authorizing Provider  amLODipine (NORVASC) 5 MG tablet Take 1 tablet (5 mg total) by mouth daily. 07/13/18   Raeford RazorKohut, Dixon Luczak, MD  ATORVASTATIN CALCIUM PO Take by mouth daily.    [provider]  fluticasone (FLONASE) 50 MCG/ACT nasal spray Place 1 spray into both nostrils daily. 07/13/18   Raeford RazorKohut, Harpreet Signore, MD  HYDROcodone-acetaminophen (NORCO/VICODIN) 5-325 MG tablet Take 1 tablet by mouth every 6 (six) hours as needed. Patient not taking: Reported on 06/13/2018 02/12/17   Mathews RobinsonsMitchell, Jessica B, PA-C  losartan (COZAAR) 100 MG tablet Take 1 tablet (100 mg total) by mouth daily. 07/13/18   Raeford RazorKohut, Julicia Krieger, MD  OMEPRAZOLE PO Take by mouth. As directed.    [provider]  trimethoprim-polymyxin b (POLYTRIM) ophthalmic solution 1 drop every 6 (six) hours. To affected eye.    [provider]    Family  History Family History  Problem Relation Age of Onset  . Hypertension Mother     Social History Social History   Tobacco Use  . Smoking status: Former Games developermoker  . Smokeless tobacco: Never Used  Substance Use Topics  . Alcohol use: Yes    Comment: "sometimes"  . Drug use: Yes    Types: Marijuana     Allergies   Patient has no known allergies.   Review of Systems Review of Systems  All systems reviewed and negative, other than as noted in HPI.  Physical Exam Updated Vital Signs BP (!) 236/140 (BP Location: Right Arm)   Pulse 81   Temp 98.2 F (36.8 C) (Oral)   Resp 18   Ht 6' (1.829 m)   Wt 113.4 kg   SpO2 99%   BMI 33.91 kg/m   Physical Exam  Constitutional: He appears well-developed and well-nourished. No distress.  HENT:  Head: Normocephalic.  Mild left turbinates has a friable appearing area.  No significant bleeding noted.  Posterior pharynx is clear.  I cannot appreciate an obvious polyp.  Eyes: Conjunctivae are normal. Right eye exhibits no discharge. Left eye exhibits no discharge.  Neck: Neck supple.  Cardiovascular: Normal rate, regular rhythm and normal heart sounds. Exam reveals no gallop and no friction rub.  No murmur heard. Pulmonary/Chest: Effort normal and breath sounds normal.  No respiratory distress.  Abdominal: Soft. He exhibits no distension. There is no tenderness.  Musculoskeletal: He exhibits no edema or tenderness.  Neurological: He is alert.  Skin: Skin is warm and dry.  Psychiatric: He has a normal mood and affect. His behavior is normal. Thought content normal.  Nursing note and vitals reviewed.    ED Treatments / Results  Labs (all labs ordered are listed, but only abnormal results are displayed) Labs Reviewed - No data to display  EKG None  Radiology No results found.  Procedures Procedures (including critical care time)  Medications Ordered in ED Medications - No data to display   Initial Impression / Assessment  and Plan / ED Course  I have reviewed the triage vital signs and the nursing notes.  Pertinent labs & imaging results that were available during my care of the patient were reviewed by me and considered in my medical decision making (see chart for details).     52 year old male with mild intermittent epistaxis.  Afrin spray provided.  No active bleeding currently.  ENT follow-up as needed.  Noted to be extremely hypertensive.  None of his symptoms suggest acute end organ damage.  He is on Cozaar previously prescribed from the emergency room.  We will increase this to 100 mg.  We will also add Norvasc.  Stressed the importance of establishing a primary care provider soon as he can.  Final Clinical Impressions(s) / ED Diagnoses   Final diagnoses:  Essential hypertension  Epistaxis    ED Discharge Orders    None       Raeford Razor, MD 07/13/18 [phone]

## 2018-07-13 NOTE — ED Triage Notes (Addendum)
Pt states that he has been out of BP meds x 2 days. Pt states that there's a "polyp" in his nose. Pt states when he breathes or blows his nose something feels like it's flickering internally. Pt states he has noticed it bleeding the last 2 days.

## 2018-07-13 NOTE — ED Notes (Signed)
Instructed pt on use of afrin.

## 2018-10-07 ENCOUNTER — Encounter (HOSPITAL_COMMUNITY): Payer: Self-pay

## 2018-10-07 DIAGNOSIS — T65891A Toxic effect of other specified substances, accidental (unintentional), initial encounter: Secondary | ICD-10-CM | POA: Insufficient documentation

## 2018-10-07 DIAGNOSIS — I10 Essential (primary) hypertension: Secondary | ICD-10-CM | POA: Insufficient documentation

## 2018-10-07 DIAGNOSIS — Z87891 Personal history of nicotine dependence: Secondary | ICD-10-CM | POA: Insufficient documentation

## 2018-10-07 DIAGNOSIS — F329 Major depressive disorder, single episode, unspecified: Secondary | ICD-10-CM | POA: Insufficient documentation

## 2018-10-07 DIAGNOSIS — Z79899 Other long term (current) drug therapy: Secondary | ICD-10-CM | POA: Insufficient documentation

## 2018-10-07 DIAGNOSIS — I158 Other secondary hypertension: Secondary | ICD-10-CM | POA: Insufficient documentation

## 2018-10-07 DIAGNOSIS — F419 Anxiety disorder, unspecified: Secondary | ICD-10-CM | POA: Insufficient documentation

## 2018-10-07 NOTE — ED Triage Notes (Signed)
Pt arrived stating he accidentally took a swallow of an unknown substance that was diluted into a gallon water bottle. He attempted to make himself throw up and saw blood. Family stating it could be carpet cleaner.  Pt hypertensive in triage, states he ran out of his blood pressure medication last week and needs a new prescription.

## 2018-10-08 ENCOUNTER — Emergency Department (HOSPITAL_COMMUNITY): Payer: Self-pay

## 2018-10-08 ENCOUNTER — Emergency Department (HOSPITAL_COMMUNITY)
Admission: EM | Admit: 2018-10-08 | Discharge: 2018-10-08 | Disposition: A | Discharge status: Home / Self Care | Payer: Self-pay | Attending: Emergency Medicine | Admitting: Emergency Medicine

## 2018-10-08 DIAGNOSIS — I158 Other secondary hypertension: Secondary | ICD-10-CM

## 2018-10-08 DIAGNOSIS — T6591XA Toxic effect of unspecified substance, accidental (unintentional), initial encounter: Secondary | ICD-10-CM

## 2018-10-08 LAB — CBC WITH DIFFERENTIAL/PLATELET
Abs Immature Granulocytes: 0.03 10*3/uL (ref 0.00–0.07)
Basophils Absolute: 0 10*3/uL (ref 0.0–0.1)
Basophils Relative: 1 %
EOS ABS: 0.2 10*3/uL (ref 0.0–0.5)
Eosinophils Relative: 3 %
HEMATOCRIT: 44.7 % (ref 39.0–52.0)
Hemoglobin: 14.8 g/dL (ref 13.0–17.0)
Immature Granulocytes: 0 %
LYMPHS ABS: 2.6 10*3/uL (ref 0.7–4.0)
Lymphocytes Relative: 32 %
MCH: 30.3 pg (ref 26.0–34.0)
MCHC: 33.1 g/dL (ref 30.0–36.0)
MCV: 91.6 fL (ref 80.0–100.0)
Monocytes Absolute: 0.7 10*3/uL (ref 0.1–1.0)
Monocytes Relative: 9 %
Neutro Abs: 4.5 10*3/uL (ref 1.7–7.7)
Neutrophils Relative %: 55 %
Platelets: 244 10*3/uL (ref 150–400)
RBC: 4.88 MIL/uL (ref 4.22–5.81)
RDW: 13.6 % (ref 11.5–15.5)
WBC: 8 10*3/uL (ref 4.0–10.5)
nRBC: 0 % (ref 0.0–0.2)

## 2018-10-08 LAB — COMPREHENSIVE METABOLIC PANEL
ALK PHOS: 73 U/L (ref 38–126)
ALT: 19 U/L (ref 0–44)
AST: 23 U/L (ref 15–41)
Albumin: 4.1 g/dL (ref 3.5–5.0)
Anion gap: 10 (ref 5–15)
BUN: 13 mg/dL (ref 6–20)
CALCIUM: 8.8 mg/dL — AB (ref 8.9–10.3)
CO2: 27 mmol/L (ref 22–32)
Chloride: 101 mmol/L (ref 98–111)
Creatinine, Ser: 0.96 mg/dL (ref 0.61–1.24)
GFR calc Af Amer: >60 mL/min (ref >60)
GFR calc non Af Amer: >60 mL/min (ref >60)
Glucose, Bld: 150 mg/dL — ABNORMAL HIGH (ref 70–99)
Potassium: 3.7 mmol/L (ref 3.5–5.1)
Sodium: 138 mmol/L (ref 135–145)
Total Bilirubin: 0.3 mg/dL (ref 0.3–1.2)
Total Protein: 7 g/dL (ref 6.5–8.1)

## 2018-10-08 MED ORDER — LOSARTAN POTASSIUM 50 MG PO TABS
100.0000 mg | ORAL_TABLET | Freq: Once | ORAL | Status: AC
  Administered 2018-10-08: 100 mg via ORAL
  Filled 2018-10-08: qty 2

## 2018-10-08 MED ORDER — AMLODIPINE BESYLATE 5 MG PO TABS
5.0000 mg | ORAL_TABLET | Freq: Once | ORAL | Status: AC
  Administered 2018-10-08: 5 mg via ORAL
  Filled 2018-10-08: qty 1

## 2018-10-08 MED ORDER — LOSARTAN POTASSIUM 100 MG PO TABS: 100.0000 mg | ORAL_TABLET | Freq: Every day | ORAL | 1 refills | Status: DC

## 2018-10-08 MED ORDER — AMLODIPINE BESYLATE 5 MG PO TABS: 5.0000 mg | ORAL_TABLET | Freq: Every day | ORAL | 1 refills | Status: DC

## 2018-10-08 NOTE — ED Provider Notes (Signed)
[facility]-EMERGENCY DEPT Provider Note  CSN: [id number] Arrival date & time: 10/07/18 2123  Chief Complaint(s) Ingestion (Unknown substance )  HPI Casey LefevreKeith D Garrett is a 53 y.o. male   The history is provided by the patient.  Ingestion  This is a new problem. Episode onset: 4 hrs. Episode frequency: once. The problem has not changed since onset.Pertinent negatives include no chest pain, no abdominal pain, no headaches and no shortness of breath. Nothing aggravates the symptoms. Nothing relieves the symptoms.   Accidentally took a sip of carpet cleaner.  Patient reports that he tried to make himself throw up but was unable to.  Stated that he spit up mucus with streaky blood.  Denies any nausea, chest pain, abdominal pain, throat pain.  No difficulty breathing.  Past Medical History Past Medical History:  Diagnosis Date  . Anxiety   . Depression   . Hypertension   . Migraines    There are no active problems to display for this patient.  Home Medication(s) Prior to Admission medications   Medication Sig Start Date End Date Taking? Authorizing Provider  amLODipine (NORVASC) 5 MG tablet Take 1 tablet (5 mg total) by mouth daily. 10/08/18   Nira Connardama, Juelle Dickmann Eduardo, MD  ATORVASTATIN CALCIUM PO Take by mouth daily.    [provider]  fluticasone (FLONASE) 50 MCG/ACT nasal spray Place 1 spray into both nostrils daily. 07/13/18   Raeford RazorKohut, Stephen, MD  HYDROcodone-acetaminophen (NORCO/VICODIN) 5-325 MG tablet Take 1 tablet by mouth every 6 (six) hours as needed. Patient not taking: Reported on 06/13/2018 02/12/17   Mathews RobinsonsMitchell, Jessica B, PA-C  losartan (COZAAR) 100 MG tablet Take 1 tablet (100 mg total) by mouth daily. 10/08/18   Nira Connardama, Mearle Drew Eduardo, MD  OMEPRAZOLE PO Take by mouth. As directed.    [provider]  trimethoprim-polymyxin b (POLYTRIM) ophthalmic solution 1 drop every 6 (six) hours. To affected eye.    [provider]                                                                                                                            Past Surgical History History reviewed. No pertinent surgical history. Family History Family History  Problem Relation Age of Onset  . Hypertension Mother     Social History Social History   Tobacco Use  . Smoking status: Former Games developermoker  . Smokeless tobacco: Never Used  Substance Use Topics  . Alcohol use: Yes    Comment: "sometimes"  . Drug use: Yes    Types: Marijuana   Allergies Patient has no known allergies.  Review of Systems Review of Systems  Respiratory: Negative for shortness of breath.   Cardiovascular: Negative for chest pain.  Gastrointestinal: Negative for abdominal pain.  Neurological: Negative for headaches.   All other systems are reviewed and are negative for acute change except as noted in the HPI  Physical Exam Vital Signs  I have reviewed the triage vital signs  BP (!) 189/122 (BP Location: Left Arm)   Pulse 84   Temp 98.8 F (37.1 C) (Oral)   Resp (!) 22   Ht 6' (1.829 m)   Wt 113.4 kg   SpO2 96%   BMI 33.91 kg/m   Physical Exam Vitals signs reviewed.  Constitutional:      General: He is not in acute distress.    Appearance: He is well-developed. He is not diaphoretic.  HENT:     Head: Normocephalic and atraumatic.     Nose: Nose normal.  Eyes:     General: No scleral icterus.       Right eye: No discharge.        Left eye: No discharge.     Conjunctiva/sclera: Conjunctivae normal.     Pupils: Pupils are equal, round, and reactive to light.  Neck:     Musculoskeletal: Normal range of motion and neck supple.  Cardiovascular:     Rate and Rhythm: Normal rate and regular rhythm.     Heart sounds: No murmur. No friction rub. No gallop.   Pulmonary:     Effort: Pulmonary effort is normal. No respiratory distress.     Breath sounds: Normal breath sounds. No stridor. No rales.  Abdominal:     General: There is no  distension.     Palpations: Abdomen is soft.     Tenderness: There is no abdominal tenderness.  Musculoskeletal:        General: No tenderness.  Skin:    General: Skin is warm and dry.     Findings: No erythema or rash.  Neurological:     Mental Status: He is alert and oriented to person, place, and time.     ED Results and Treatments Labs (all labs ordered are listed, but only abnormal results are displayed) Labs Reviewed  COMPREHENSIVE METABOLIC PANEL - Abnormal; Notable for the following components:      Result Value   Glucose, Bld 150 (*)    Calcium 8.8 (*)    All other components within normal limits  CBC WITH DIFFERENTIAL/PLATELET  CBG MONITORING, ED                                                                                                                         EKG  EKG Interpretation  Date/Time:  Tuesday October 08 2018 00:38:44 EST Ventricular Rate:  86 PR Interval:    QRS Duration: 113 QT Interval:  362 QTC Calculation: 433 R Axis:   92 Text Interpretation:  Age not entered, assumed to be  53 years old for purpose of ECG interpretation Sinus rhythm Prolonged PR interval Borderline intraventricular conduction delay Low voltage, precordial leads Borderline abnrm T, anterolateral leads NO STEMI Confirmed by Drema Pry [phone]) on 10/08/2018 1:25:45 AM      Radiology Dg Chest 2 View  Result Date: 10/08/2018 CLINICAL DATA:  Ingestion of unknown substance.  Hemoptysis. EXAM: CHEST - 2 VIEW COMPARISON:  None. FINDINGS: Heart is  borderline in size. Mild peribronchial thickening. No confluent opacities or effusions. No acute bony abnormality. IMPRESSION: Borderline heart size.  Mild bronchitic changes. Electronically Signed   By: Charlett Nose M.D.   On: 10/08/2018 01:01   Pertinent labs & imaging results that were available during my care of the patient were reviewed by me and considered in my medical decision making (see chart for details).  Medications  Ordered in ED Medications  losartan (COZAAR) tablet 100 mg (has no administration in time range)  amLODipine (NORVASC) tablet 5 mg (has no administration in time range)                                                                                                                                    Procedures Procedures  (including critical care time)  Medical Decision Making / ED Course I have reviewed the nursing notes for this encounter and the patient's prior records (if available in EHR or on provided paperwork).    Accidental ingestion of small amount of carpet cleaner soap.  Patient is well-appearing, nontoxic.  Abdomen benign.  Work-up reassuring.  Poison control called and reported that the patient is tolerating p.o. intake, he is cleared.  Also noted to be hypertensive and ran out of his medication.  No evidence of endorgan damage.  The patient appears reasonably screened and/or stabilized for discharge and I doubt any other medical condition or other [facility] requiring further screening, evaluation, or treatment in the ED at this time prior to discharge.  The patient is safe for discharge with strict return precautions.   Final Clinical Impression(s) / ED Diagnoses Final diagnoses:  Ingestion of substance  Ingestion of substance, accidental or unintentional, initial encounter  Other secondary hypertension   Disposition: Discharge  Condition: Good  I have discussed the results, Dx and Tx plan with the patient who expressed understanding and agree(s) with the plan. Discharge instructions discussed at great length. The patient was given strict return precautions who verbalized understanding of the instructions. No further questions at time of discharge.    ED Discharge Orders         Ordered    amLODipine (NORVASC) 5 MG tablet  Daily     10/08/18 0229    losartan (COZAAR) 100 MG tablet  Daily     10/08/18 0229           Follow Up: Primary care provider  Schedule  an appointment as soon as possible for a visit  If you do not have a primary care physician, contact [facility] at [phone] for referral      This chart was dictated using voice recognition software.  Despite best efforts to proofread,  errors can occur which can change the documentation meaning.   Nira Conn, MD 10/08/18 [phone]

## 2018-10-08 NOTE — ED Notes (Signed)
Poison control recommends PO challenge the pt. If pt is able to tolerate pt can be medically cleared. If pt is unable to tolerate pt may need endoscopy to investigate any irritation/damage to GI tract.

## 2018-10-08 NOTE — ED Notes (Signed)
Pt given water and crackers to PO challenge per recommendations of poison control.

## 2020-12-17 ENCOUNTER — Emergency Department (HOSPITAL_COMMUNITY)
Admission: EM | Admit: 2020-12-17 | Discharge: 2020-12-17 | Disposition: A | Discharge status: Home / Self Care | Payer: Self-pay | Attending: Emergency Medicine | Admitting: Emergency Medicine

## 2020-12-17 ENCOUNTER — Emergency Department (HOSPITAL_COMMUNITY): Payer: Medicaid Other

## 2020-12-17 ENCOUNTER — Other Ambulatory Visit: Payer: Self-pay

## 2020-12-17 ENCOUNTER — Encounter (HOSPITAL_COMMUNITY): Payer: Self-pay | Admitting: *Deleted

## 2020-12-17 DIAGNOSIS — Z87891 Personal history of nicotine dependence: Secondary | ICD-10-CM | POA: Insufficient documentation

## 2020-12-17 DIAGNOSIS — Z20822 Contact with and (suspected) exposure to covid-19: Secondary | ICD-10-CM | POA: Insufficient documentation

## 2020-12-17 DIAGNOSIS — J029 Acute pharyngitis, unspecified: Secondary | ICD-10-CM | POA: Insufficient documentation

## 2020-12-17 DIAGNOSIS — I10 Essential (primary) hypertension: Secondary | ICD-10-CM | POA: Insufficient documentation

## 2020-12-17 LAB — COMPREHENSIVE METABOLIC PANEL
ALT: 22 U/L (ref 0–44)
AST: 19 U/L (ref 15–41)
Albumin: 3.9 g/dL (ref 3.5–5.0)
Alkaline Phosphatase: 86 U/L (ref 38–126)
Anion gap: 11 (ref 5–15)
BUN: 22 mg/dL — ABNORMAL HIGH (ref 6–20)
CO2: 25 mmol/L (ref 22–32)
Calcium: 8.9 mg/dL (ref 8.9–10.3)
Chloride: 103 mmol/L (ref 98–111)
Creatinine, Ser: 1.1 mg/dL (ref 0.61–1.24)
GFR, Estimated: >60 mL/min (ref >60)
Glucose, Bld: 173 mg/dL — ABNORMAL HIGH (ref 70–99)
Potassium: 3.9 mmol/L (ref 3.5–5.1)
Sodium: 139 mmol/L (ref 135–145)
Total Bilirubin: 0.6 mg/dL (ref 0.3–1.2)
Total Protein: 7.3 g/dL (ref 6.5–8.1)

## 2020-12-17 LAB — CBC WITH DIFFERENTIAL/PLATELET
Abs Immature Granulocytes: 0.02 10*3/uL (ref 0.00–0.07)
Basophils Absolute: 0.1 10*3/uL (ref 0.0–0.1)
Basophils Relative: 1 %
Eosinophils Absolute: 0.3 10*3/uL (ref 0.0–0.5)
Eosinophils Relative: 4 %
HCT: 47.6 % (ref 39.0–52.0)
Hemoglobin: 15.9 g/dL (ref 13.0–17.0)
Immature Granulocytes: 0 %
Lymphocytes Relative: 25 %
Lymphs Abs: 2.1 10*3/uL (ref 0.7–4.0)
MCH: 29.6 pg (ref 26.0–34.0)
MCHC: 33.4 g/dL (ref 30.0–36.0)
MCV: 88.6 fL (ref 80.0–100.0)
Monocytes Absolute: 0.6 10*3/uL (ref 0.1–1.0)
Monocytes Relative: 7 %
Neutro Abs: 5.3 10*3/uL (ref 1.7–7.7)
Neutrophils Relative %: 63 %
Platelets: 252 10*3/uL (ref 150–400)
RBC: 5.37 MIL/uL (ref 4.22–5.81)
RDW: 12.6 % (ref 11.5–15.5)
WBC: 8.4 10*3/uL (ref 4.0–10.5)
nRBC: 0 % (ref 0.0–0.2)

## 2020-12-17 MED ORDER — AMLODIPINE BESYLATE 5 MG PO TABS: 5.0000 mg | ORAL_TABLET | Freq: Every day | ORAL | 0 refills | Status: DC

## 2020-12-17 MED ORDER — AMLODIPINE BESYLATE 5 MG PO TABS
5.0000 mg | ORAL_TABLET | Freq: Once | ORAL | Status: AC
  Administered 2020-12-17: 5 mg via ORAL
  Filled 2020-12-17: qty 1

## 2020-12-17 NOTE — ED Notes (Signed)
Provider aware of trending blood pressure.

## 2020-12-17 NOTE — Discharge Instructions (Addendum)
Your laboratory results are within normal limits.  I have provided a prescription for blood pressure medication, please take 1 tablet daily.  You will need to schedule an appointment with a primary care physician, the number to the [facility] health and wellness clinic is attached to your chart, please schedule an appointment in order to establish primary care.    If you experience any chest pain, shortness of breath, please return to the ED.

## 2020-12-17 NOTE — ED Provider Notes (Signed)
[facility]-EMERGENCY DEPT Provider Note   CSN: [id number] Arrival date & time: 12/17/20  1422     History Chief Complaint  Patient presents with  . Cough  . Sore Throat    Casey Garrett is a 55 y.o. male.  55 y.o male with a PMH of uncontrolled hypertension presents to the ED with a chief complaint of sore throat, adductive cough for the past 3 days.  Patient reports the symptoms are usually present at night, he feels like he cannot breathe, as he likely wakes up with his mouth open, when he snores really loud.  Also reports that this is resolved when he does a deep cough.  He does report prior history of sleep apnea, currently does not wear any CPAP machine while at home.  Feels that his throat feels somewhat sore, describing it as "very sore".  He has taken some cough syrup, apple cider vinegar, aloe which has not helped with his sore throat.  Feels that he coughed really hard this morning which he did notice some blood-tinged sputum present.  This happened once again prior to arrival into the ED.  Prior to the symptoms beginning he did have some sinus congestion which is now resolved.  Also endorses a prior history of hypertension, not taking any medications as he currently does not follow-up with any primary care physician.  Does endorse smoking "herbs". No chest pain, no shortness of breath, or headaches, or fevers.     The history is provided by the patient.  Cough Cough characteristics:  Dry Sputum characteristics:  Bloody Severity:  Mild Onset quality:  Gradual Duration:  3 days Timing:  Constant Progression:  Worsening Chronicity:  New Relieved by:  Nothing Worsened by:  Lying down Associated symptoms: sinus congestion and sore throat   Associated symptoms: no chills, no ear pain, no fever, no myalgias, no rhinorrhea and no wheezing   Sore Throat       Past Medical History:  Diagnosis Date  . Anxiety   . Depression   . Hypertension   .  Migraines     There are no problems to display for this patient.   History reviewed. No pertinent surgical history.     Family History  Problem Relation Age of Onset  . Hypertension Mother     Social History   Tobacco Use  . Smoking status: Former Games developer  . Smokeless tobacco: Never Used  Vaping Use  . Vaping Use: Never used  Substance Use Topics  . Alcohol use: Yes    Comment: "sometimes"  . Drug use: Yes    Types: Marijuana    Home Medications Prior to Admission medications   Medication Sig Start Date End Date Taking? Authorizing Provider  amLODipine (NORVASC) 5 MG tablet Take 1 tablet (5 mg total) by mouth daily. 12/17/20 01/16/21  Claude Manges, PA-C  ATORVASTATIN CALCIUM PO Take by mouth daily.    [provider]  fluticasone (FLONASE) 50 MCG/ACT nasal spray Place 1 spray into both nostrils daily. 07/13/18   Raeford Razor, MD  HYDROcodone-acetaminophen (NORCO/VICODIN) 5-325 MG tablet Take 1 tablet by mouth every 6 (six) hours as needed. Patient not taking: Reported on 06/13/2018 02/12/17   Mathews Robinsons B, PA-C  losartan (COZAAR) 100 MG tablet Take 1 tablet (100 mg total) by mouth daily. 10/08/18   Nira Conn, MD  OMEPRAZOLE PO Take by mouth. As directed.    [provider]  trimethoprim-polymyxin b (POLYTRIM) ophthalmic solution 1 drop  every 6 (six) hours. To affected eye.    [provider]    Allergies    Patient has no known allergies.  Review of Systems   Review of Systems  Constitutional: Negative for chills and fever.  HENT: Positive for sore throat. Negative for ear pain and rhinorrhea.   Respiratory: Positive for cough. Negative for wheezing.   Musculoskeletal: Negative for myalgias.    Physical Exam Updated Vital Signs BP (!) 176/130   Pulse (!) 108   Temp 98.6 F (37 C) (Oral)   Resp 19   Ht 6' (1.829 m)   Wt 113.4 kg   SpO2 98%   BMI 33.91 kg/m   Physical Exam Vitals and nursing note reviewed.   Constitutional:      Appearance: He is well-developed.  HENT:     Head: Normocephalic and atraumatic.     Nose: No congestion or rhinorrhea.     Mouth/Throat:     Mouth: Mucous membranes are moist.     Pharynx: Uvula midline. No oropharyngeal exudate.     Tonsils: No tonsillar exudate.  Cardiovascular:     Rate and Rhythm: Normal rate.  Pulmonary:     Effort: Pulmonary effort is normal.     Breath sounds: No wheezing or rales.  Abdominal:     Palpations: Abdomen is soft.  Musculoskeletal:     Cervical back: Normal range of motion and neck supple.  Skin:    General: Skin is warm and dry.  Neurological:     Mental Status: He is alert and oriented to person, place, and time.     ED Results / Procedures / Treatments   Labs (all labs ordered are listed, but only abnormal results are displayed) Labs Reviewed  COMPREHENSIVE METABOLIC PANEL - Abnormal; Notable for the following components:      Result Value   Glucose, Bld 173 (*)    BUN 22 (*)    All other components within normal limits  SARS CORONAVIRUS 2 (TAT 6-24 HRS)  CBC WITH DIFFERENTIAL/PLATELET    EKG EKG Interpretation  Date/Time:  Friday December 17 2020 15:38:07 EDT Ventricular Rate:  99 PR Interval:  191 QRS Duration: 157 QT Interval:  345 QTC Calculation: 443 R Axis:   125 Text Interpretation: Sinus rhythm Right bundle branch block 12 Lead; Mason-Likar no stemi Confirmed by Marianna Fuss ([id number]) on 12/17/2020 4:23:22 PM   Radiology DG Chest 2 View  Result Date: 12/17/2020 CLINICAL DATA:  Shortness of breath.  Patient reports productive. EXAM: CHEST - 2 VIEW COMPARISON:  10/08/2018 FINDINGS: Heart is normal in size.The cardiomediastinal contours are normal. The lungs are clear. Pulmonary vasculature is normal. No consolidation, pleural effusion, or pneumothorax. No acute osseous abnormalities are seen. IMPRESSION: No acute chest findings or explanation for shortness of breath. Electronically Signed   By:  Narda Rutherford M.D.   On: 12/17/2020 15:29    Procedures Procedures   Medications Ordered in ED Medications  amLODipine (NORVASC) tablet 5 mg (5 mg Oral Given 12/17/20 1533)    ED Course  I have reviewed the triage vital signs and the nursing notes.  Pertinent labs & imaging results that were available during my care of the patient were reviewed by me and considered in my medical decision making (see chart for details).    MDM Rules/Calculators/A&P   Patient with a PMH of HTN, currently not taking any medications as he currently does not have any medication, a prescription, or primary care provider.  He  arrived in the ED hypertensive with a blood pressure of 194/120, reports he has not taken medication in several months.  Seen here multiple times for elevated blood pressure, epistaxis, hypertensive urgency.  On today's visit he denies any chest pain, shortness of breath, headaches, fevers.  Does report he has had more so of a sore throat for the past 3 days, had some blood-tinged sputum.  Does report using over-the-counter therapy with some improvement of tenderness.  He is concerned for COVID-19 infection, we obtain a COVID swab on today's visit.  He was given losartan 5 mg to help with BP control.  Will obtain screening labs to further evaluate and rule out any urgency at this time.  Chest x-ray without any acute finding.   Interpretation of his labs are without any acute findings. CMP without any electrolyte abnormality, creatinine level is within normal limits.  LFTs are unremarkable.  She was tested for COVID-19 as he request concern for this.  Blood pressure recheck, remains hypertensive but without any chest pain, headache or shortness of breath. We discussed appropriate follow-up with primary care physician.  We will go home with a prescription for Norvasc 5 mg. Return precautions discussed at length.    Portions of this note were generated with Scientist, clinical (histocompatibility and immunogenetics).  Dictation errors may occur despite best attempts at proofreading.  Final Clinical Impression(s) / ED Diagnoses Final diagnoses:  Sore throat  Hypertension, unspecified type    Rx / DC Orders ED Discharge Orders         Ordered    amLODipine (NORVASC) 5 MG tablet  Daily        12/17/20 1640           Claude Manges, PA-C 12/17/20 1700    Milagros Loll, MD 12/17/20 [phone]

## 2020-12-17 NOTE — ED Triage Notes (Addendum)
3 days of sore throat which has improved but still feel raw, productive cough with some blood tinged sputum today. No fever. No N/V. BP 194/120 has not been able to afford meds due to no insurance

## 2020-12-18 LAB — SARS CORONAVIRUS 2 (TAT 6-24 HRS): SARS Coronavirus 2: NEGATIVE

## 2021-01-20 ENCOUNTER — Other Ambulatory Visit: Payer: Self-pay

## 2021-01-20 ENCOUNTER — Encounter (HOSPITAL_COMMUNITY): Payer: Self-pay | Admitting: Emergency Medicine

## 2021-01-20 ENCOUNTER — Emergency Department (HOSPITAL_COMMUNITY)
Admission: EM | Admit: 2021-01-20 | Discharge: 2021-01-20 | Disposition: A | Discharge status: Home / Self Care | Payer: Medicaid Other | Attending: Emergency Medicine | Admitting: Emergency Medicine

## 2021-01-20 DIAGNOSIS — I10 Essential (primary) hypertension: Secondary | ICD-10-CM | POA: Insufficient documentation

## 2021-01-20 DIAGNOSIS — R300 Dysuria: Secondary | ICD-10-CM | POA: Insufficient documentation

## 2021-01-20 DIAGNOSIS — Z87891 Personal history of nicotine dependence: Secondary | ICD-10-CM | POA: Insufficient documentation

## 2021-01-20 DIAGNOSIS — Z79899 Other long term (current) drug therapy: Secondary | ICD-10-CM | POA: Insufficient documentation

## 2021-01-20 LAB — URINALYSIS, ROUTINE W REFLEX MICROSCOPIC
Bacteria, UA: NONE SEEN
Bilirubin Urine: NEGATIVE
Glucose, UA: 50 mg/dL — AB
Ketones, ur: NEGATIVE mg/dL
Nitrite: NEGATIVE
Protein, ur: 100 mg/dL — AB
RBC / HPF: >50 RBC/hpf — ABNORMAL HIGH (ref 0–5)
Specific Gravity, Urine: 1.023 (ref 1.005–1.030)
WBC, UA: >50 WBC/hpf — ABNORMAL HIGH (ref 0–5)
pH: 5 (ref 5.0–8.0)

## 2021-01-20 MED ORDER — AMLODIPINE BESYLATE 5 MG PO TABS
10.0000 mg | ORAL_TABLET | Freq: Once | ORAL | Status: AC
  Administered 2021-01-20: 10 mg via ORAL
  Filled 2021-01-20: qty 2

## 2021-01-20 MED ORDER — DOXYCYCLINE HYCLATE 100 MG PO TABS
100.0000 mg | ORAL_TABLET | Freq: Once | ORAL | Status: AC
  Administered 2021-01-20: 100 mg via ORAL
  Filled 2021-01-20: qty 1

## 2021-01-20 MED ORDER — AMLODIPINE BESYLATE 10 MG PO TABS: 10.0000 mg | ORAL_TABLET | Freq: Every day | ORAL | 0 refills | Status: DC

## 2021-01-20 MED ORDER — LIDOCAINE HCL (PF) 1 % IJ SOLN
2.0000 mL | Freq: Once | INTRAMUSCULAR | Status: AC
  Administered 2021-01-20: 2 mL
  Filled 2021-01-20: qty 30

## 2021-01-20 MED ORDER — CEFTRIAXONE SODIUM 1 G IJ SOLR
1.0000 g | Freq: Once | INTRAMUSCULAR | Status: AC
  Administered 2021-01-20: 1 g via INTRAMUSCULAR
  Filled 2021-01-20: qty 10

## 2021-01-20 MED ORDER — DOXYCYCLINE HYCLATE 100 MG PO CAPS: 100.0000 mg | ORAL_CAPSULE | Freq: Two times a day (BID) | ORAL | 0 refills | Status: DC

## 2021-01-20 MED ORDER — CEPHALEXIN 500 MG PO CAPS: 500.0000 mg | ORAL_CAPSULE | Freq: Three times a day (TID) | ORAL | 0 refills | Status: AC

## 2021-01-20 NOTE — ED Provider Notes (Signed)
[facility] EMERGENCY DEPARTMENT Provider Note  CSN: [id number] Arrival date & time: 01/20/21 1943    History Chief Complaint  Patient presents with  . Dysuria    HPI  Casey Garrett is a 55 y.o. male with history of uncontrolled HTN, not compliant with meds or follow up presents for evaluation of dysuria and hematuria for the last day or two. Pain is worse at the end of urination. Similar in pain to when he had a cystoscopy years ago, he can't remember why. He denies any penile discharge but he has had unprotected sex. Denies any headache, blurry vision, chest pain, SOB, N/V/D or abdominal pain.    Past Medical History:  Diagnosis Date  . Anxiety   . Depression   . Hypertension   . Migraines     History reviewed. No pertinent surgical history.  Family History  Problem Relation Age of Onset  . Hypertension Mother     Social History   Tobacco Use  . Smoking status: Former Games developer  . Smokeless tobacco: Never Used  Vaping Use  . Vaping Use: Never used  Substance Use Topics  . Alcohol use: Yes    Comment: "sometimes"  . Drug use: Yes    Types: Marijuana     Home Medications Prior to Admission medications   Medication Sig Start Date End Date Taking? Authorizing Provider  amLODipine (NORVASC) 10 MG tablet Take 1 tablet (10 mg total) by mouth daily. 01/20/21  Yes Pollyann Savoy, MD  cephALEXin (KEFLEX) 500 MG capsule Take 1 capsule (500 mg total) by mouth 3 (three) times daily for 7 days. 01/20/21 01/27/21 Yes Pollyann Savoy, MD  doxycycline (VIBRAMYCIN) 100 MG capsule Take 1 capsule (100 mg total) by mouth 2 (two) times daily. 01/20/21  Yes Pollyann Savoy, MD  ATORVASTATIN CALCIUM PO Take by mouth daily.    [provider]  fluticasone (FLONASE) 50 MCG/ACT nasal spray Place 1 spray into both nostrils daily. 07/13/18   Raeford Razor, MD  OMEPRAZOLE PO Take by mouth. As directed.    [provider]  losartan (COZAAR) 100 MG tablet Take 1  tablet (100 mg total) by mouth daily. 10/08/18 01/20/21  Nira Conn, MD     Allergies    Patient has no known allergies.   Review of Systems   Review of Systems A comprehensive review of systems was completed and negative except as noted in HPI.    Physical Exam BP (!) 216/136   Pulse 86   Temp 98.3 F (36.8 C) (Oral)   Resp 18   SpO2 94%   Physical Exam Vitals and nursing note reviewed.  Constitutional:      Appearance: Normal appearance.  HENT:     Head: Normocephalic and atraumatic.     Nose: Nose normal.     Mouth/Throat:     Mouth: Mucous membranes are moist.  Eyes:     Extraocular Movements: Extraocular movements intact.     Conjunctiva/sclera: Conjunctivae normal.  Cardiovascular:     Rate and Rhythm: Normal rate.  Pulmonary:     Effort: Pulmonary effort is normal.     Breath sounds: Normal breath sounds.  Abdominal:     General: Abdomen is flat.     Palpations: Abdomen is soft.     Tenderness: There is no abdominal tenderness.  Genitourinary:    Comments: No penile discharge or sores, no testicular swelling or tenderness Musculoskeletal:        General: No  swelling. Normal range of motion.     Cervical back: Neck supple.  Skin:    General: Skin is warm and dry.  Neurological:     General: No focal deficit present.     Mental Status: He is alert.  Psychiatric:        Mood and Affect: Mood normal.      ED Results / Procedures / Treatments   Labs (all labs ordered are listed, but only abnormal results are displayed) Labs Reviewed  URINALYSIS, ROUTINE W REFLEX MICROSCOPIC - Abnormal; Notable for the following components:      Result Value   APPearance HAZY (*)    Glucose, UA 50 (*)    Hgb urine dipstick LARGE (*)    Protein, ur 100 (*)    Leukocytes,Ua MODERATE (*)    RBC / HPF >50 (*)    WBC, UA >50 (*)    All other components within normal limits  GC/CHLAMYDIA PROBE AMP () NOT AT [facility]    EKG None  Radiology No  results found.  Procedures Procedures  Medications Ordered in the ED Medications  cefTRIAXone (ROCEPHIN) injection 1 g (1 g Intramuscular Given 01/20/21 2314)  amLODipine (NORVASC) tablet 10 mg (10 mg Oral Given 01/20/21 2314)  doxycycline (VIBRA-TABS) tablet 100 mg (100 mg Oral Given 01/20/21 2314)  lidocaine (PF) (XYLOCAINE) 1 % injection 2 mL (2 mLs Other Given 01/20/21 2315)     MDM Rules/Calculators/A&P MDM Patient last seen in the ED about a month ago for sore throat, noted to be hypertensive then, similar to all previous visits. Given Rx for Amlodipine which lost. Has not had PCP in a long time. Will check UA and GC/CT but no other symptoms concerning for acute end organ damage from his chronic hypertension.  ED Course  I have reviewed the triage vital signs and the nursing notes.  Pertinent labs & imaging results that were available during my care of the patient were reviewed by me and considered in my medical decision making (see chart for details).  Clinical Course as of 01/20/21 2327  Thu Jan 20, 2021  2253 UA with signs of infection. Will treat with Rocephin IM and doxycycline for possible STI as well as Keflex for cystitis pending culture results. Will re-prescribe amlodipine as a starting place, he has been on multiple HTN meds in the past but it has been years since he took anything. He remains asymptomatic now, but I stressed the importance of medication compliance and close follow up to avoid catastrophic effects of uncontrolled BP including stroke, atherosclerosis or ESRD.  [CS]    Clinical Course User Index [CS] Pollyann Savoy, MD    Final Clinical Impression(s) / ED Diagnoses Final diagnoses:  Dysuria  Primary hypertension    Rx / DC Orders ED Discharge Orders         Ordered    doxycycline (VIBRAMYCIN) 100 MG capsule  2 times daily        01/20/21 2258    cephALEXin (KEFLEX) 500 MG capsule  3 times daily        01/20/21 2258    amLODipine (NORVASC) 10  MG tablet  Daily        01/20/21 2258           Pollyann Savoy, MD 01/20/21 [phone]

## 2021-01-20 NOTE — ED Triage Notes (Addendum)
Patient reports dysuria and hematuria since this morning. States hx of UTI.  Hypertensive in triage. States he lost printed prescription for blood pressure medication that was provided at last ED visit.

## 2021-01-21 LAB — GC/CHLAMYDIA PROBE AMP (~~LOC~~) NOT AT ARMC
Chlamydia: NEGATIVE
Comment: NEGATIVE
Comment: NORMAL
Neisseria Gonorrhea: NEGATIVE

## 2021-01-29 ENCOUNTER — Emergency Department (HOSPITAL_COMMUNITY)
Admission: EM | Admit: 2021-01-29 | Discharge: 2021-01-29 | Disposition: A | Discharge status: Home / Self Care | Payer: Self-pay | Attending: Emergency Medicine | Admitting: Emergency Medicine

## 2021-01-29 ENCOUNTER — Other Ambulatory Visit: Payer: Self-pay

## 2021-01-29 ENCOUNTER — Emergency Department (HOSPITAL_COMMUNITY): Payer: Self-pay

## 2021-01-29 DIAGNOSIS — Z20822 Contact with and (suspected) exposure to covid-19: Secondary | ICD-10-CM | POA: Insufficient documentation

## 2021-01-29 DIAGNOSIS — I10 Essential (primary) hypertension: Secondary | ICD-10-CM | POA: Insufficient documentation

## 2021-01-29 DIAGNOSIS — Z79899 Other long term (current) drug therapy: Secondary | ICD-10-CM | POA: Insufficient documentation

## 2021-01-29 DIAGNOSIS — R1110 Vomiting, unspecified: Secondary | ICD-10-CM | POA: Insufficient documentation

## 2021-01-29 DIAGNOSIS — R0602 Shortness of breath: Secondary | ICD-10-CM | POA: Insufficient documentation

## 2021-01-29 DIAGNOSIS — Z87891 Personal history of nicotine dependence: Secondary | ICD-10-CM | POA: Insufficient documentation

## 2021-01-29 DIAGNOSIS — J069 Acute upper respiratory infection, unspecified: Secondary | ICD-10-CM | POA: Insufficient documentation

## 2021-01-29 LAB — CBC WITH DIFFERENTIAL/PLATELET
Abs Immature Granulocytes: 0.02 10*3/uL (ref 0.00–0.07)
Basophils Absolute: 0 10*3/uL (ref 0.0–0.1)
Basophils Relative: 0 %
Eosinophils Absolute: 0.4 10*3/uL (ref 0.0–0.5)
Eosinophils Relative: 5 %
HCT: 46.7 % (ref 39.0–52.0)
Hemoglobin: 15.7 g/dL (ref 13.0–17.0)
Immature Granulocytes: 0 %
Lymphocytes Relative: 28 %
Lymphs Abs: 2 10*3/uL (ref 0.7–4.0)
MCH: 29.6 pg (ref 26.0–34.0)
MCHC: 33.6 g/dL (ref 30.0–36.0)
MCV: 87.9 fL (ref 80.0–100.0)
Monocytes Absolute: 0.9 10*3/uL (ref 0.1–1.0)
Monocytes Relative: 12 %
Neutro Abs: 4 10*3/uL (ref 1.7–7.7)
Neutrophils Relative %: 55 %
Platelets: 217 10*3/uL (ref 150–400)
RBC: 5.31 MIL/uL (ref 4.22–5.81)
RDW: 13.2 % (ref 11.5–15.5)
WBC: 7.3 10*3/uL (ref 4.0–10.5)
nRBC: 0 % (ref 0.0–0.2)

## 2021-01-29 LAB — TROPONIN I (HIGH SENSITIVITY)
Troponin I (High Sensitivity): 11 ng/L (ref <18)
Troponin I (High Sensitivity): 12 ng/L (ref <18)

## 2021-01-29 LAB — BASIC METABOLIC PANEL
Anion gap: 10 (ref 5–15)
BUN: 17 mg/dL (ref 6–20)
CO2: 24 mmol/L (ref 22–32)
Calcium: 8.7 mg/dL — ABNORMAL LOW (ref 8.9–10.3)
Chloride: 104 mmol/L (ref 98–111)
Creatinine, Ser: 1.15 mg/dL (ref 0.61–1.24)
GFR, Estimated: >60 mL/min (ref >60)
Glucose, Bld: 162 mg/dL — ABNORMAL HIGH (ref 70–99)
Potassium: 4.2 mmol/L (ref 3.5–5.1)
Sodium: 138 mmol/L (ref 135–145)

## 2021-01-29 LAB — RESP PANEL BY RT-PCR (FLU A&B, COVID) ARPGX2
Influenza A by PCR: NEGATIVE
Influenza B by PCR: NEGATIVE
SARS Coronavirus 2 by RT PCR: NEGATIVE

## 2021-01-29 MED ORDER — BENZONATATE 100 MG PO CAPS: 100.0000 mg | ORAL_CAPSULE | Freq: Three times a day (TID) | ORAL | 0 refills | Status: DC

## 2021-01-29 MED ORDER — ALBUTEROL SULFATE HFA 108 (90 BASE) MCG/ACT IN AERS
4.0000 | INHALATION_SPRAY | Freq: Once | RESPIRATORY_TRACT | Status: AC
  Administered 2021-01-29: 4 via RESPIRATORY_TRACT
  Filled 2021-01-29: qty 6.7

## 2021-01-29 MED ORDER — METHYLPREDNISOLONE SODIUM SUCC 125 MG IJ SOLR
125.0000 mg | Freq: Once | INTRAMUSCULAR | Status: AC
  Administered 2021-01-29: 125 mg via INTRAVENOUS
  Filled 2021-01-29: qty 2

## 2021-01-29 MED ORDER — PREDNISONE 20 MG PO TABS: 40.0000 mg | ORAL_TABLET | Freq: Every day | ORAL | 0 refills | Status: AC

## 2021-01-29 NOTE — ED Provider Notes (Signed)
[facility]-EMERGENCY DEPT Provider Note   CSN: [id number] Arrival date & time: 01/29/21  1546     History Chief Complaint  Patient presents with  . Cough    Casey Garrett is a 55 y.o. male sent in for evaluation of cough, shortness of breath, chest pain, vomiting.  Patient states symptoms of been present for the past week.  He reports symptoms are worse at night, will often wake up having coughing fits and feeling like he cannot breathe.  He sometimes coughs so hard that he vomits.  He states his cough is nonproductive.  He has a history of bronchitis, but does not use inhalers.  He denies fevers, chills, current chest pain, abdominal pain, urinary symptoms, abnormal bowel movements.  He is not vaccinated for COVID.  He denies sick contacts.  HPI     Past Medical History:  Diagnosis Date  . Anxiety   . Depression   . Hypertension   . Migraines     There are no problems to display for this patient.   No past surgical history on file.     Family History  Problem Relation Age of Onset  . Hypertension Mother     Social History   Tobacco Use  . Smoking status: Former Games developer  . Smokeless tobacco: Never Used  Vaping Use  . Vaping Use: Never used  Substance Use Topics  . Alcohol use: Yes    Comment: "sometimes"  . Drug use: Yes    Types: Marijuana    Home Medications Prior to Admission medications   Medication Sig Start Date End Date Taking? Authorizing Provider  amLODipine (NORVASC) 10 MG tablet Take 1 tablet (10 mg total) by mouth daily. 01/20/21   Pollyann Savoy, MD  ATORVASTATIN CALCIUM PO Take by mouth daily.    [provider]  benzonatate (TESSALON) 100 MG capsule Take 1 capsule (100 mg total) by mouth every 8 (eight) hours. 01/29/21  Yes Hau Sanor, PA-C  doxycycline (VIBRAMYCIN) 100 MG capsule Take 1 capsule (100 mg total) by mouth 2 (two) times daily. 01/20/21   Pollyann Savoy, MD  fluticasone (FLONASE) 50  MCG/ACT nasal spray Place 1 spray into both nostrils daily. 07/13/18   Raeford Razor, MD  OMEPRAZOLE PO Take by mouth. As directed.    [provider]  predniSONE (DELTASONE) 20 MG tablet Take 2 tablets (40 mg total) by mouth daily for 4 days. 01/30/21 02/03/21 Yes Wynell Halberg, PA-C  losartan (COZAAR) 100 MG tablet Take 1 tablet (100 mg total) by mouth daily. 10/08/18 01/20/21  Nira Conn, MD    Allergies    Patient has no known allergies.  Review of Systems   Review of Systems  Respiratory: Positive for cough and shortness of breath.   Gastrointestinal: Positive for vomiting.  All other systems reviewed and are negative.   Physical Exam Updated Vital Signs BP (!) 179/118   Pulse 100   Temp 98.1 F (36.7 C) (Oral)   Resp (!) 24   Ht 6' (1.829 m)   Wt 113.4 kg   SpO2 92%   BMI 33.91 kg/m   Physical Exam Vitals and nursing note reviewed.  Constitutional:      General: He is not in acute distress.    Appearance: He is well-developed.     Comments: Resting in the bed in NAD  HENT:     Head: Normocephalic and atraumatic.  Eyes:     Extraocular Movements: Extraocular movements intact.  Conjunctiva/sclera: Conjunctivae normal.     Pupils: Pupils are equal, round, and reactive to light.  Cardiovascular:     Rate and Rhythm: Normal rate and regular rhythm.     Pulses: Normal pulses.  Pulmonary:     Effort: Pulmonary effort is normal. No respiratory distress.     Breath sounds: Wheezing present.     Comments: Scattered expiratory wheezing. Speaking in full sentences. Dry/hoarse cough noted on exam.  Abdominal:     General: There is no distension.     Palpations: Abdomen is soft. There is no mass.     Tenderness: There is no abdominal tenderness. There is no guarding or rebound.  Musculoskeletal:        General: Normal range of motion.     Cervical back: Normal range of motion and neck supple.     Right lower leg: No edema.     Left lower leg: No  edema.  Skin:    General: Skin is warm and dry.     Capillary Refill: Capillary refill takes less than 2 seconds.  Neurological:     Mental Status: He is alert and oriented to person, place, and time.     ED Results / Procedures / Treatments   Labs (all labs ordered are listed, but only abnormal results are displayed) Labs Reviewed  BASIC METABOLIC PANEL - Abnormal; Notable for the following components:      Result Value   Glucose, Bld 162 (*)    Calcium 8.7 (*)    All other components within normal limits  RESP PANEL BY RT-PCR (FLU A&B, COVID) ARPGX2  CBC WITH DIFFERENTIAL/PLATELET  TROPONIN I (HIGH SENSITIVITY)  TROPONIN I (HIGH SENSITIVITY)    EKG EKG Interpretation  Date/Time:  Saturday January 29 2021 16:43:13 EDT Ventricular Rate:  111 PR Interval:  204 QRS Duration: 153 QT Interval:  411 QTC Calculation: 559 R Axis:   127 Text Interpretation: Sinus tachycardia Borderline prolonged PR interval RBBB and LPFB Right bundle branch block noted 12/17/20, LPFB new No acute ischemia 12 Lead; Mason-Likar Confirmed by Pieter Partridge ([id number]) on 01/29/2021 5:55:34 PM   Radiology DG Chest 2 View  Result Date: 01/29/2021 CLINICAL DATA:  Shortness of breath EXAM: CHEST - 2 VIEW COMPARISON:  December 17, 2020 FINDINGS: The heart size and mediastinal contours are within normal limits. No focal consolidation. No pleural effusion. No pneumothorax. The visualized skeletal structures are unremarkable. IMPRESSION: No active cardiopulmonary disease. Electronically Signed   By: Maudry Mayhew MD   On: 01/29/2021 16:56    Procedures Procedures   Medications Ordered in ED Medications  methylPREDNISolone sodium succinate (SOLU-MEDROL) 125 mg/2 mL injection 125 mg (125 mg Intravenous Given 01/29/21 2150)  albuterol (VENTOLIN HFA) 108 (90 Base) MCG/ACT inhaler 4 puff (4 puffs Inhalation Given 01/29/21 2150)    ED Course  I have reviewed the triage vital signs and the nursing notes.  Pertinent labs &  imaging results that were available during my care of the patient were reviewed by me and considered in my medical decision making (see chart for details).    MDM Rules/Calculators/A&P                          Patient presenting for evaluation of cough and shortness of breath.  On exam, patient peers nontoxic.  Symptoms are worse at night.  Consider CHF versus asthma versus viral illness versus GERD.  In the setting of scattered wheezing, likely asthma.  Labs obtained in triage interpreted by me, overall reassuring.  Troponin negative x2.  Chest x-ray viewed and independently interpreted by me, no pneumonia pneumothorax or effusion.  Will test for COVID and flu.  Will treat with steroids and albuterol and reassess.  Will ensure patient is able to ambulate without hypoxia.  Patient ambulated without hypoxia.  On reassessment, patient reports significant improvement of symptoms.  Pulmonary exam is improved.  Discussed likely viral illness.  Discussed treatment with prednisone, albuterol, cough medicine.  At this time, patient appears safe for discharge.  Return precautions given.  Patient states he understands and agrees to plan  Final Clinical Impression(s) / ED Diagnoses Final diagnoses:  Viral URI with cough    Rx / DC Orders ED Discharge Orders         Ordered    predniSONE (DELTASONE) 20 MG tablet  Daily        01/29/21 2301    benzonatate (TESSALON) 100 MG capsule  Every 8 hours        01/29/21 2301           Alveria Apley, PA-C 01/29/21 2303    Koleen Distance, MD 01/29/21 2315

## 2021-01-29 NOTE — ED Provider Notes (Signed)
Emergency Medicine Provider Triage Evaluation Note  Casey Garrett , a 55 y.o. male  was evaluated in triage.  Pt complains of dry cough for the past week.  Worse at night.  States that he is also having chest pain, back pain and shortness of breath.  Denies any fevers or sick contacts with similar symptoms.  Review of Systems  Positive: Cough, chest pain, back pain, shortness of breath Negative: Fevers  Physical Exam  BP (!) 169/115 (BP Location: Right Arm)   Pulse 83   Temp 98.4 F (36.9 C) (Oral)   Resp 20   Ht 6' (1.829 m)   Wt 113.4 kg   SpO2 98%   BMI 33.91 kg/m  Gen:   Awake, no distress   Resp:  Normal effort  MSK:   Moves extremities without difficulty  Other:  Cough noted on exam  Medical Decision Making  Medically screening exam initiated at 4:32 PM.  Appropriate orders placed.  Casey Garrett was informed that the remainder of the evaluation will be completed by another provider, this initial triage assessment does not replace that evaluation, and the importance of remaining in the ED until their evaluation is complete.  Labs, EKG and chest x-ray   Dietrich Pates, PA-C 01/29/21 1633    Lorre Nick, MD 01/30/21 [phone]

## 2021-01-29 NOTE — Discharge Instructions (Signed)
Take prednisone as prescribed for the next 4 days starting tomorrow. Use albuterol every 4 hours for the next 2 days while awake.  After this, use as needed for shortness of breath, coughing fits, wheezing. Use cough medicine as prescribed. Return to the emergency room with any new, worsening, concerning symptoms

## 2021-01-29 NOTE — ED Notes (Signed)
Patients oxygen stayed steady at 95 while ambulating.

## 2021-01-29 NOTE — ED Triage Notes (Signed)
Patient reports he has been coughing x1 week, no productive. Says its worse at night. Coughs so hard he gets out of breath. Says he now has something protruding out of his stomach from coughing. Diarrhea x 4 days. Headaches. Pain rated 8/10

## 2021-02-13 ENCOUNTER — Emergency Department (HOSPITAL_COMMUNITY)
Admission: EM | Admit: 2021-02-13 | Discharge: 2021-02-13 | Disposition: A | Discharge status: Home / Self Care | Payer: Medicaid Other | Attending: Emergency Medicine | Admitting: Emergency Medicine

## 2021-02-13 ENCOUNTER — Encounter (HOSPITAL_COMMUNITY): Payer: Self-pay | Admitting: Emergency Medicine

## 2021-02-13 DIAGNOSIS — R0981 Nasal congestion: Secondary | ICD-10-CM | POA: Insufficient documentation

## 2021-02-13 DIAGNOSIS — Z87891 Personal history of nicotine dependence: Secondary | ICD-10-CM | POA: Insufficient documentation

## 2021-02-13 DIAGNOSIS — Z79899 Other long term (current) drug therapy: Secondary | ICD-10-CM | POA: Insufficient documentation

## 2021-02-13 DIAGNOSIS — Z20822 Contact with and (suspected) exposure to covid-19: Secondary | ICD-10-CM | POA: Insufficient documentation

## 2021-02-13 DIAGNOSIS — Z202 Contact with and (suspected) exposure to infections with a predominantly sexual mode of transmission: Secondary | ICD-10-CM | POA: Insufficient documentation

## 2021-02-13 DIAGNOSIS — I10 Essential (primary) hypertension: Secondary | ICD-10-CM | POA: Insufficient documentation

## 2021-02-13 DIAGNOSIS — R369 Urethral discharge, unspecified: Secondary | ICD-10-CM | POA: Insufficient documentation

## 2021-02-13 MED ORDER — METRONIDAZOLE 500 MG PO TABS
2000.0000 mg | ORAL_TABLET | Freq: Once | ORAL | Status: AC
  Administered 2021-02-13: 2000 mg via ORAL
  Filled 2021-02-13: qty 4

## 2021-02-13 MED ORDER — DOXYCYCLINE HYCLATE 100 MG PO TABS
100.0000 mg | ORAL_TABLET | Freq: Once | ORAL | Status: AC
  Administered 2021-02-13: 100 mg via ORAL
  Filled 2021-02-13: qty 1

## 2021-02-13 MED ORDER — CEFTRIAXONE SODIUM 1 G IJ SOLR
500.0000 mg | Freq: Once | INTRAMUSCULAR | Status: AC
  Administered 2021-02-13: 500 mg via INTRAMUSCULAR
  Filled 2021-02-13: qty 10

## 2021-02-13 MED ORDER — STERILE WATER FOR INJECTION IJ SOLN
INTRAMUSCULAR | Status: AC
  Filled 2021-02-13: qty 10

## 2021-02-13 NOTE — ED Provider Notes (Signed)
[facility]-EMERGENCY DEPT Provider Note   CSN: [id number] Arrival date & time: 02/13/21  1130     History Chief Complaint  Patient presents with   Exposure to STD    Casey Garrett is a 55 y.o. male.  Patient here for COVID test as he had a close exposure.  Also here for treatment for trichomonas.  Has had some discharge from his penis since yesterday.  The history is provided by the patient.  Exposure to STD This is a new problem. The problem occurs constantly. The problem has not changed since onset.Nothing aggravates the symptoms. Nothing relieves the symptoms. He has tried nothing for the symptoms. The treatment provided no relief.      Past Medical History:  Diagnosis Date   Anxiety    Depression    Hypertension    Migraines     There are no problems to display for this patient.   History reviewed. No pertinent surgical history.     Family History  Problem Relation Age of Onset   Hypertension Mother     Social History   Tobacco Use   Smoking status: Former    Pack years: 0.00   Smokeless tobacco: Never  Vaping Use   Vaping Use: Never used  Substance Use Topics   Alcohol use: Yes    Comment: "sometimes"   Drug use: Yes    Types: Marijuana    Home Medications Prior to Admission medications   Medication Sig Start Date End Date Taking? Authorizing Provider  amLODipine (NORVASC) 10 MG tablet Take 1 tablet (10 mg total) by mouth daily. 01/20/21   Pollyann Savoy, MD  ATORVASTATIN CALCIUM PO Take by mouth daily.    [provider]  benzonatate (TESSALON) 100 MG capsule Take 1 capsule (100 mg total) by mouth every 8 (eight) hours. 01/29/21   Caccavale, Sophia, PA-C  doxycycline (VIBRAMYCIN) 100 MG capsule Take 1 capsule (100 mg total) by mouth 2 (two) times daily. 01/20/21   Pollyann Savoy, MD  fluticasone (FLONASE) 50 MCG/ACT nasal spray Place 1 spray into both nostrils daily. 07/13/18   Raeford Razor, MD   OMEPRAZOLE PO Take by mouth. As directed.    [provider]  losartan (COZAAR) 100 MG tablet Take 1 tablet (100 mg total) by mouth daily. 10/08/18 01/20/21  Nira Conn, MD    Allergies    Patient has no known allergies.  Review of Systems   Review of Systems  Constitutional:  Negative for fever.  HENT:  Positive for congestion.   Genitourinary:  Positive for penile discharge. Negative for dysuria, penile pain, penile swelling, scrotal swelling and testicular pain.   Physical Exam Updated Vital Signs  ED Triage Vitals  Enc Vitals Group     BP 02/13/21 1135 (!) 192/130     Pulse Rate 02/13/21 1135 98     Resp 02/13/21 1135 16     Temp 02/13/21 1139 98.5 F (36.9 C)     Temp Source 02/13/21 1135 Oral     SpO2 02/13/21 1135 96 %     Weight --      Height --      Head Circumference --      Peak Flow --      Pain Score 02/13/21 1141 0     Pain Loc --      Pain Edu? --      Excl. in GC? --      Physical Exam Abdominal:  General: Abdomen is flat.     Tenderness: There is no abdominal tenderness.  Genitourinary:    Comments: Scant white discharge from urethral meatus Neurological:     Mental Status: He is alert.    ED Results / Procedures / Treatments   Labs (all labs ordered are listed, but only abnormal results are displayed) Labs Reviewed  SARS CORONAVIRUS 2 (TAT 6-24 HRS)  GC/CHLAMYDIA PROBE AMP ([place]) NOT AT [facility]  URINE CYTOLOGY ANCILLARY ONLY    EKG None  Radiology No results found.  Procedures Procedures   Medications Ordered in ED Medications  metroNIDAZOLE (FLAGYL) tablet 2,000 mg (has no administration in time range)  cefTRIAXone (ROCEPHIN) injection 500 mg (has no administration in time range)  doxycycline (VIBRA-TABS) tablet 100 mg (has no administration in time range)    ED Course  I have reviewed the triage vital signs and the nursing notes.  Pertinent labs & imaging results that were available during my  care of the patient were reviewed by me and considered in my medical decision making (see chart for details).    MDM Rules/Calculators/A&P                          [name] is here for treatment for trichomonas and for COVID test.  Normal vitals.  No fever.  Was told that he was exposed to trichomonas and also exposed to possibly COVID.  He has had some nasal congestion but that is been ongoing.  No respiratory distress.  He started to notice some penile discharge yesterday.  Will treat for gonorrhea, chlamydia, trichomonas.  Will test for COVID.  Discharged in good condition.  Understands return precautions.  Educated about safe sex and completing antibioitcs before enganging in sexual activity.  This chart was dictated using voice recognition software.  Despite best efforts to proofread,  errors can occur which can change the documentation meaning.   Final Clinical Impression(s) / ED Diagnoses Final diagnoses:  STD exposure  Penile discharge    Rx / DC Orders ED Discharge Orders     None        Virgina Norfolk, DO 02/13/21 1148

## 2021-02-13 NOTE — ED Triage Notes (Signed)
Patient here from home reporting STD exposure. Also reports Covid like symptoms.

## 2021-02-14 LAB — GC/CHLAMYDIA PROBE AMP (~~LOC~~) NOT AT ARMC
Chlamydia: NEGATIVE
Comment: NEGATIVE
Comment: NORMAL
Neisseria Gonorrhea: NEGATIVE

## 2021-02-14 LAB — SARS CORONAVIRUS 2 (TAT 6-24 HRS): SARS Coronavirus 2: NEGATIVE

## 2021-02-15 LAB — URINE CYTOLOGY ANCILLARY ONLY
Comment: NEGATIVE
Trichomonas: POSITIVE — AB

## 2021-03-18 ENCOUNTER — Emergency Department (HOSPITAL_COMMUNITY)
Admission: EM | Admit: 2021-03-18 | Discharge: 2021-03-19 | Disposition: A | Discharge status: Home / Self Care | Payer: Self-pay | Attending: Emergency Medicine | Admitting: Emergency Medicine

## 2021-03-18 ENCOUNTER — Other Ambulatory Visit: Payer: Self-pay

## 2021-03-18 ENCOUNTER — Encounter (HOSPITAL_COMMUNITY): Payer: Self-pay

## 2021-03-18 ENCOUNTER — Emergency Department (HOSPITAL_COMMUNITY): Payer: Self-pay

## 2021-03-18 DIAGNOSIS — I10 Essential (primary) hypertension: Secondary | ICD-10-CM | POA: Insufficient documentation

## 2021-03-18 DIAGNOSIS — R55 Syncope and collapse: Secondary | ICD-10-CM | POA: Insufficient documentation

## 2021-03-18 DIAGNOSIS — R0602 Shortness of breath: Secondary | ICD-10-CM | POA: Insufficient documentation

## 2021-03-18 DIAGNOSIS — K429 Umbilical hernia without obstruction or gangrene: Secondary | ICD-10-CM | POA: Insufficient documentation

## 2021-03-18 DIAGNOSIS — R079 Chest pain, unspecified: Secondary | ICD-10-CM

## 2021-03-18 DIAGNOSIS — R0789 Other chest pain: Secondary | ICD-10-CM | POA: Insufficient documentation

## 2021-03-18 DIAGNOSIS — Z7982 Long term (current) use of aspirin: Secondary | ICD-10-CM | POA: Insufficient documentation

## 2021-03-18 DIAGNOSIS — Z79899 Other long term (current) drug therapy: Secondary | ICD-10-CM | POA: Insufficient documentation

## 2021-03-18 DIAGNOSIS — R059 Cough, unspecified: Secondary | ICD-10-CM | POA: Insufficient documentation

## 2021-03-18 DIAGNOSIS — Z7952 Long term (current) use of systemic steroids: Secondary | ICD-10-CM | POA: Insufficient documentation

## 2021-03-18 DIAGNOSIS — Z87891 Personal history of nicotine dependence: Secondary | ICD-10-CM | POA: Insufficient documentation

## 2021-03-18 LAB — URINALYSIS, ROUTINE W REFLEX MICROSCOPIC
Bacteria, UA: NONE SEEN
Bilirubin Urine: NEGATIVE
Glucose, UA: 150 mg/dL — AB
Hgb urine dipstick: NEGATIVE
Ketones, ur: NEGATIVE mg/dL
Leukocytes,Ua: NEGATIVE
Nitrite: NEGATIVE
Protein, ur: 30 mg/dL — AB
Specific Gravity, Urine: 1.01 (ref 1.005–1.030)
pH: 5 (ref 5.0–8.0)

## 2021-03-18 LAB — CBC WITH DIFFERENTIAL/PLATELET
Abs Immature Granulocytes: 0 10*3/uL (ref 0.00–0.07)
Basophils Absolute: 0 10*3/uL (ref 0.0–0.1)
Basophils Relative: 0 %
Eosinophils Absolute: 0.1 10*3/uL (ref 0.0–0.5)
Eosinophils Relative: 1 %
HCT: 44.6 % (ref 39.0–52.0)
Hemoglobin: 15.1 g/dL (ref 13.0–17.0)
Immature Granulocytes: 0 %
Lymphocytes Relative: 42 %
Lymphs Abs: 1.7 10*3/uL (ref 0.7–4.0)
MCH: 29.4 pg (ref 26.0–34.0)
MCHC: 33.9 g/dL (ref 30.0–36.0)
MCV: 86.8 fL (ref 80.0–100.0)
Monocytes Absolute: 0.3 10*3/uL (ref 0.1–1.0)
Monocytes Relative: 9 %
Neutro Abs: 1.9 10*3/uL (ref 1.7–7.7)
Neutrophils Relative %: 48 %
Platelets: 174 10*3/uL (ref 150–400)
RBC: 5.14 MIL/uL (ref 4.22–5.81)
RDW: 13.6 % (ref 11.5–15.5)
WBC: 4 10*3/uL (ref 4.0–10.5)
nRBC: 0 % (ref 0.0–0.2)

## 2021-03-18 LAB — COMPREHENSIVE METABOLIC PANEL
ALT: 23 U/L (ref 0–44)
AST: 26 U/L (ref 15–41)
Albumin: 3.8 g/dL (ref 3.5–5.0)
Alkaline Phosphatase: 84 U/L (ref 38–126)
Anion gap: 9 (ref 5–15)
BUN: 15 mg/dL (ref 6–20)
CO2: 24 mmol/L (ref 22–32)
Calcium: 8.4 mg/dL — ABNORMAL LOW (ref 8.9–10.3)
Chloride: 104 mmol/L (ref 98–111)
Creatinine, Ser: 0.94 mg/dL (ref 0.61–1.24)
GFR, Estimated: >60 mL/min (ref >60)
Glucose, Bld: 285 mg/dL — ABNORMAL HIGH (ref 70–99)
Potassium: 3.9 mmol/L (ref 3.5–5.1)
Sodium: 137 mmol/L (ref 135–145)
Total Bilirubin: 0.7 mg/dL (ref 0.3–1.2)
Total Protein: 6.6 g/dL (ref 6.5–8.1)

## 2021-03-18 LAB — TROPONIN I (HIGH SENSITIVITY)
Troponin I (High Sensitivity): 9 ng/L (ref <18)
Troponin I (High Sensitivity): 9 ng/L (ref <18)

## 2021-03-18 LAB — LIPASE, BLOOD: Lipase: 27 U/L (ref 11–51)

## 2021-03-18 MED ORDER — IOHEXOL 350 MG/ML SOLN
100.0000 mL | Freq: Once | INTRAVENOUS | Status: AC | PRN
  Administered 2021-03-18: 100 mL via INTRAVENOUS

## 2021-03-18 NOTE — ED Triage Notes (Signed)
Patient c/o feeling like a "gas bubble in his mid back and left chest x 3 days. Patient states he feels like he has a "ball in the upper abdomen. Today, the patient states he leaned forward and the abdomen was very sore and grew larger. Patient states he passed out at this time while at work.

## 2021-03-18 NOTE — ED Provider Notes (Signed)
Emergency Medicine Provider Triage Evaluation Note  Casey Garrett , a 55 y.o. male  was evaluated in triage.  Pt complains of intermittent left-sided pleuritic chest pain over the last 3 days.  Radiates into his back.  Feels like he cannot take a deep breath.  No cough.  No prior history of PE, DVT.  He is not anticoagulated.  States he bent over earlier today his abdomen became very large at his epigastric and mid abdomen and he had a syncopal episode.  He denies any prior history of hernias.  No headache, constipation, dysuria, numbness, weakness, AAA, dissection  Review of Systems  Positive: Chest pain, shortness of breath, syncope, abdominal pain Negative: Weakness, fever, chills, emesis  Physical Exam  BP (!) 165/111 (BP Location: Left Arm)   Pulse 97   Temp 98.5 F (36.9 C) (Oral)   Resp 16   Ht 6' (1.829 m)   Wt 108.4 kg   SpO2 94%   BMI 32.41 kg/m  Gen:   Awake, no distress   Resp:  Normal effort  MSK:   Moves extremities without difficulty  ABD:  Soft, mildly distended, tenderness to epigastric region Other:    Medical Decision Making  Medically screening exam initiated at 6:19 PM.  Appropriate orders placed.  Merric Yost Vanwagoner was informed that the remainder of the evaluation will be completed by another provider, this initial triage assessment does not replace that evaluation, and the importance of remaining in the ED until their evaluation is complete.  Chest pain, shortness of breath, syncope   Richerd Grime A, PA-C 03/18/21 1821    Alvira Monday, MD 03/19/21 [phone]

## 2021-03-19 NOTE — Discharge Instructions (Addendum)
Your cardiac work-up today was normal.  No evidence of heart attack, blood clot, or other concerning findings. You do have a hernia of your abdominal wall near your navel, this is the bulge you have been feeling.  You can follow-up with the general surgery clinic to have evaluation for repair. Follow-up with your primary care doctor. Return here for new concerns.

## 2021-03-19 NOTE — ED Provider Notes (Signed)
[facility]-EMERGENCY DEPT Provider Note   CSN: [id number] Arrival date & time: 03/18/21  1748     History Chief Complaint  Patient presents with   Chest Pain   Loss of Consciousness    Casey Garrett is a 55 y.o. male.  The history is provided by the patient and medical records.  Chest Pain Associated symptoms: syncope   Loss of Consciousness Associated symptoms: chest pain    55 y.o. M with hx of anxiety, depression, HTN, migraines, presenting to the ED with multiple complaints.   Abdominal bulge-- states he has noticed this lately when trying to poop, cough, or sit up from lying position.  States it feel like a big "ball" in his abdomen but does go back down.  Denies vomiting or trouble with BM's.  No prior abdominal surgeries. Chest pain-- intermittent x3 days.  States it feels like "vibration" in his chest and back.  He does report some coughing but very minimal.  No fever/chills.  Occasionally feels SOB and sometimes has sharp pain when taking deep breath.  No cardiac history.   Near syncope-- states happened today at work after having BM in bathroom.  States he got very hot and sweaty but never lost consciousness.  States his coworkers took him to the [place] and sat him in the [facility] with cold water and he got better.  Past Medical History:  Diagnosis Date   Anxiety    Depression    Hypertension    Migraines     There are no problems to display for this patient.   History reviewed. No pertinent surgical history.     Family History  Problem Relation Age of Onset   Hypertension Mother     Social History   Tobacco Use   Smoking status: Former   Smokeless tobacco: Never  Building services engineer Use: Never used  Substance Use Topics   Alcohol use: Yes    Comment: "sometimes"   Drug use: Not Currently    Types: Marijuana    Home Medications Prior to Admission medications   Medication Sig Start Date End Date Taking? Authorizing Provider   albuterol (VENTOLIN HFA) 108 (90 Base) MCG/ACT inhaler Inhale 2 puffs into the lungs every 6 (six) hours as needed for wheezing or shortness of breath.   Yes [provider]  amLODipine (NORVASC) 10 MG tablet Take 1 tablet (10 mg total) by mouth daily. 01/20/21  Yes Pollyann Savoy, MD  Aspirin-Acetaminophen-Caffeine (GOODYS EXTRA STRENGTH) [phone] MG PACK Take 1 packet by mouth daily as needed (headache).   Yes [provider]  benzonatate (TESSALON) 100 MG capsule Take 1 capsule (100 mg total) by mouth every 8 (eight) hours. Patient not taking: No sig reported 01/29/21   Caccavale, Sophia, PA-C  doxycycline (VIBRAMYCIN) 100 MG capsule Take 1 capsule (100 mg total) by mouth 2 (two) times daily. Patient not taking: No sig reported 01/20/21   Pollyann Savoy, MD  fluticasone [facility]) 50 MCG/ACT nasal spray Place 1 spray into both nostrils daily. Patient not taking: No sig reported 07/13/18   Raeford Razor, MD  losartan (COZAAR) 100 MG tablet Take 1 tablet (100 mg total) by mouth daily. 10/08/18 01/20/21  Nira Conn, MD    Allergies    Patient has no known allergies.  Review of Systems   Review of Systems  Cardiovascular:  Positive for chest pain and syncope.  All other systems reviewed and are negative.  Physical Exam Updated  Vital Signs BP (!) 172/106   Pulse 74   Temp 98.4 F (36.9 C) (Oral)   Resp (!) 23   Ht 6' (1.829 m)   Wt 108.4 kg   SpO2 100%   BMI 32.41 kg/m   Physical Exam Vitals and nursing note reviewed.  Constitutional:      Appearance: He is well-developed.  HENT:     Head: Normocephalic and atraumatic.  Eyes:     Conjunctiva/sclera: Conjunctivae normal.     Pupils: Pupils are equal, round, and reactive to light.  Cardiovascular:     Rate and Rhythm: Normal rate and regular rhythm.     Heart sounds: Normal heart sounds.  Pulmonary:     Effort: Pulmonary effort is normal.     Breath sounds: Normal breath sounds. No  wheezing or rhonchi.     Comments: Lungs CTAB Abdominal:     General: Bowel sounds are normal.     Palpations: Abdomen is soft.     Hernia: A hernia is present. Hernia is present in the umbilical area.     Comments: Umbilical hernia noted when valsalva done, easily reducible, no overlying skin changes  Musculoskeletal:        General: Normal range of motion.     Cervical back: Normal range of motion.  Skin:    General: Skin is warm and dry.  Neurological:     Mental Status: He is alert and oriented to person, place, and time.    ED Results / Procedures / Treatments   Labs (all labs ordered are listed, but only abnormal results are displayed) Labs Reviewed  COMPREHENSIVE METABOLIC PANEL - Abnormal; Notable for the following components:      Result Value   Glucose, Bld 285 (*)    Calcium 8.4 (*)    All other components within normal limits  URINALYSIS, ROUTINE W REFLEX MICROSCOPIC - Abnormal; Notable for the following components:   Glucose, UA 150 (*)    Protein, ur 30 (*)    All other components within normal limits  CBC WITH DIFFERENTIAL/PLATELET  LIPASE, BLOOD  TROPONIN I (HIGH SENSITIVITY)  TROPONIN I (HIGH SENSITIVITY)    EKG None  Radiology DG Chest 2 View  Result Date: 03/18/2021 CLINICAL DATA:  Chest pain.  Syncope. EXAM: CHEST - 2 VIEW COMPARISON:  Radiograph 01/29/2021 FINDINGS: The cardiomediastinal contours are normal. The lungs are clear. Pulmonary vasculature is normal. No consolidation, pleural effusion, or pneumothorax. No acute osseous abnormalities are seen. IMPRESSION: No acute pulmonary process. Electronically Signed   By: Narda RutherfordMelanie  Sanford M.D.   On: 03/18/2021 19:26   CT Angio Chest PE W/Cm &/Or Wo Cm  Result Date: 03/18/2021 CLINICAL DATA:  PE suspected, high prob; Epigastric pain Abdominal distension. EXAM: CT ANGIOGRAPHY CHEST CT ABDOMEN AND PELVIS WITH CONTRAST TECHNIQUE: Multidetector CT imaging of the chest was performed using the standard  protocol during bolus administration of intravenous contrast. Multiplanar CT image reconstructions and MIPs were obtained to evaluate the vascular anatomy. Multidetector CT imaging of the abdomen and pelvis was performed using the standard protocol during bolus administration of intravenous contrast. CONTRAST:  100mL OMNIPAQUE IOHEXOL 350 MG/ML SOLN COMPARISON:  Chest radiograph earlier today. Abdominopelvic CT 05/05/2016 FINDINGS: CTA CHEST FINDINGS Cardiovascular: There are no filling defects within the pulmonary arteries to suggest pulmonary embolus. Subsegmental assessment in the lung bases is obscured by breathing motion artifact. The thoracic aorta is normal in caliber conventional branching pattern from the aortic arch. No aortic dissection. Heart is normal  in size no pericardial effusion. Mediastinum/Nodes: No enlarged mediastinal or hilar lymph nodes. No esophageal wall thickening. No visualized thyroid nodule Lungs/Pleura: Motion artifact in the lung bases. No acute or focal airspace disease. 3 mm perifissural nodule in the right lower lobe, series 10, image 100. Mild central bronchial thickening. No findings of pulmonary edema. No pleural fluid. Musculoskeletal: There are no acute or suspicious osseous abnormalities. Thoracic spondylosis with endplate spurring. Review of the MIP images confirms the above findings. CT ABDOMEN and PELVIS FINDINGS Hepatobiliary: No focal liver abnormality is seen. No gallstones, gallbladder wall thickening, or biliary dilatation. Pancreas: Unremarkable. No pancreatic ductal dilatation or surrounding inflammatory changes. Spleen: Normal in size without focal abnormality. Adrenals/Urinary Tract: Normal adrenal glands. No hydronephrosis or perinephric edema. Homogeneous renal enhancement with symmetric excretion on delayed phase imaging. No visualized renal calculi or focal lesion. The urinary bladder is near completely empty, wall thickening versus nondistention. No  perivesicular fat stranding. Stomach/Bowel: Tiny hiatal hernia. Ingested material within the stomach. No small bowel obstruction or inflammatory change. Normal appendix. Small to moderate colonic stool burden. Descending and sigmoid colonic diverticulosis without diverticulitis. No acute colonic inflammation. Vascular/Lymphatic: Aortic atherosclerosis. No aortic aneurysm. Patent portal vein. No enlarged lymph nodes in the abdomen or pelvis. Reproductive: Prominent prostate gland spans 5.2 cm. Other: No free air, free fluid, or intra-abdominal fluid collection. Tiny fat containing umbilical hernia. Small fat containing supraumbilical ventral abdominal wall hernia. No associated inflammatory change. Musculoskeletal: Degenerative change in the spine and both hips. There are no acute or suspicious osseous abnormalities. Review of the MIP images confirms the above findings. IMPRESSION: 1. No pulmonary embolus. 2. Mild central bronchial thickening, can be seen with bronchitis or reactive airways disease. 3. Colonic diverticulosis without diverticulitis. 4. Urinary bladder is near completely empty, wall thickening versus nondistention. Recommend correlation with urinalysis. 5. Mildly enlarged prostate gland. 6. Small fat containing umbilical and supraumbilical ventral abdominal wall hernia. Aortic Atherosclerosis (ICD10-I70.0). Electronically Signed   By: Narda Rutherford M.D.   On: 03/18/2021 21:15   CT Abdomen Pelvis W Contrast  Result Date: 03/18/2021 CLINICAL DATA:  PE suspected, high prob; Epigastric pain Abdominal distension. EXAM: CT ANGIOGRAPHY CHEST CT ABDOMEN AND PELVIS WITH CONTRAST TECHNIQUE: Multidetector CT imaging of the chest was performed using the standard protocol during bolus administration of intravenous contrast. Multiplanar CT image reconstructions and MIPs were obtained to evaluate the vascular anatomy. Multidetector CT imaging of the abdomen and pelvis was performed using the standard protocol  during bolus administration of intravenous contrast. CONTRAST:  OMNIPAQUE IOHEXOL 350 MG/ML SOLN COMPARISON:  Chest radiograph earlier today. Abdominopelvic CT 05/05/2016 FINDINGS: CTA CHEST FINDINGS Cardiovascular: There are no filling defects within the pulmonary arteries to suggest pulmonary embolus. Subsegmental assessment in the lung bases is obscured by breathing motion artifact. The thoracic aorta is normal in caliber conventional branching pattern from the aortic arch. No aortic dissection. Heart is normal in size no pericardial effusion. Mediastinum/Nodes: No enlarged mediastinal or hilar lymph nodes. No esophageal wall thickening. No visualized thyroid nodule Lungs/Pleura: Motion artifact in the lung bases. No acute or focal airspace disease. 3 mm perifissural nodule in the right lower lobe, series 10, image 100. Mild central bronchial thickening. No findings of pulmonary edema. No pleural fluid. Musculoskeletal: There are no acute or suspicious osseous abnormalities. Thoracic spondylosis with endplate spurring. Review of the MIP images confirms the above findings. CT ABDOMEN and PELVIS FINDINGS Hepatobiliary: No focal liver abnormality is seen. No gallstones, gallbladder wall thickening, or biliary  dilatation. Pancreas: Unremarkable. No pancreatic ductal dilatation or surrounding inflammatory changes. Spleen: Normal in size without focal abnormality. Adrenals/Urinary Tract: Normal adrenal glands. No hydronephrosis or perinephric edema. Homogeneous renal enhancement with symmetric excretion on delayed phase imaging. No visualized renal calculi or focal lesion. The urinary bladder is near completely empty, wall thickening versus nondistention. No perivesicular fat stranding. Stomach/Bowel: Tiny hiatal hernia. Ingested material within the stomach. No small bowel obstruction or inflammatory change. Normal appendix. Small to moderate colonic stool burden. Descending and sigmoid colonic diverticulosis  without diverticulitis. No acute colonic inflammation. Vascular/Lymphatic: Aortic atherosclerosis. No aortic aneurysm. Patent portal vein. No enlarged lymph nodes in the abdomen or pelvis. Reproductive: Prominent prostate gland spans 5.2 cm. Other: No free air, free fluid, or intra-abdominal fluid collection. Tiny fat containing umbilical hernia. Small fat containing supraumbilical ventral abdominal wall hernia. No associated inflammatory change. Musculoskeletal: Degenerative change in the spine and both hips. There are no acute or suspicious osseous abnormalities. Review of the MIP images confirms the above findings. IMPRESSION: 1. No pulmonary embolus. 2. Mild central bronchial thickening, can be seen with bronchitis or reactive airways disease. 3. Colonic diverticulosis without diverticulitis. 4. Urinary bladder is near completely empty, wall thickening versus nondistention. Recommend correlation with urinalysis. 5. Mildly enlarged prostate gland. 6. Small fat containing umbilical and supraumbilical ventral abdominal wall hernia. Aortic Atherosclerosis (ICD10-I70.0). Electronically Signed   By: Narda Rutherford M.D.   On: 03/18/2021 21:15    Procedures Procedures   Medications Ordered in ED Medications  iohexol (OMNIPAQUE) 350 MG/ML injection 100 mL (100 mLs Intravenous Contrast Given 03/18/21 2053)    ED Course  I have reviewed the triage vital signs and the nursing notes.  Pertinent labs & imaging results that were available during my care of the patient were reviewed by me and considered in my medical decision making (see chart for details).    MDM Rules/Calculators/A&P                           55 year old male here with multiple complaints.  Abdominal bulge--present with coughing, having bowel movement, or sitting up from lying position.  Has palpable hernia on exam that is easily reducible.  CT confirms, no signs of incarceration or strangulation.  Will refer to general surgery. Chest  pain--intermittent over the past 3 days, described as a vibration.  Occasional cough and shortness of breath.  No fever or chills.  EKG is nonischemic, right bundle which is old.  Trop negative x2.  PE study also negative.  Symptoms seem atypical, low suspicion for ACS. Near syncope-- happened after BM today, improved with cold water and AC.  Work-up is reassuring. No apparent underlying arrhythmia or prolonged QT.  Likely vagal response after bowel movement.  Encouraged to follow-up with primary care doctor.  May return here for new concerns.  Final Clinical Impression(s) / ED Diagnoses Final diagnoses:  Near syncope  Chest pain in adult  Umbilical hernia without obstruction and without gangrene    Rx / DC Orders ED Discharge Orders     None        Garlon Hatchet, PA-C 03/19/21 0036    Maia Plan, MD 03/19/21 [phone]

## 2021-06-17 ENCOUNTER — Other Ambulatory Visit: Payer: Self-pay | Admitting: Surgery

## 2021-07-30 ENCOUNTER — Emergency Department (HOSPITAL_COMMUNITY): Payer: 59

## 2021-07-30 ENCOUNTER — Other Ambulatory Visit: Payer: Self-pay

## 2021-07-30 ENCOUNTER — Emergency Department (HOSPITAL_COMMUNITY)
Admission: EM | Admit: 2021-07-30 | Discharge: 2021-07-30 | Disposition: A | Discharge status: Home / Self Care | Payer: 59 | Attending: Emergency Medicine | Admitting: Emergency Medicine

## 2021-07-30 ENCOUNTER — Encounter (HOSPITAL_COMMUNITY): Payer: Self-pay | Admitting: Emergency Medicine

## 2021-07-30 DIAGNOSIS — E1140 Type 2 diabetes mellitus with diabetic neuropathy, unspecified: Secondary | ICD-10-CM | POA: Insufficient documentation

## 2021-07-30 DIAGNOSIS — R739 Hyperglycemia, unspecified: Secondary | ICD-10-CM

## 2021-07-30 DIAGNOSIS — Z79899 Other long term (current) drug therapy: Secondary | ICD-10-CM | POA: Diagnosis not present

## 2021-07-30 DIAGNOSIS — R209 Unspecified disturbances of skin sensation: Secondary | ICD-10-CM | POA: Diagnosis not present

## 2021-07-30 DIAGNOSIS — Z7952 Long term (current) use of systemic steroids: Secondary | ICD-10-CM | POA: Diagnosis not present

## 2021-07-30 DIAGNOSIS — Z87891 Personal history of nicotine dependence: Secondary | ICD-10-CM | POA: Insufficient documentation

## 2021-07-30 DIAGNOSIS — Z7984 Long term (current) use of oral hypoglycemic drugs: Secondary | ICD-10-CM | POA: Insufficient documentation

## 2021-07-30 DIAGNOSIS — G629 Polyneuropathy, unspecified: Secondary | ICD-10-CM

## 2021-07-30 DIAGNOSIS — I10 Essential (primary) hypertension: Secondary | ICD-10-CM | POA: Diagnosis not present

## 2021-07-30 LAB — COMPREHENSIVE METABOLIC PANEL
ALT: 25 U/L (ref 0–44)
AST: 17 U/L (ref 15–41)
Albumin: 3.8 g/dL (ref 3.5–5.0)
Alkaline Phosphatase: 103 U/L (ref 38–126)
Anion gap: 7 (ref 5–15)
BUN: 11 mg/dL (ref 6–20)
CO2: 27 mmol/L (ref 22–32)
Calcium: 8.6 mg/dL — ABNORMAL LOW (ref 8.9–10.3)
Chloride: 102 mmol/L (ref 98–111)
Creatinine, Ser: 0.74 mg/dL (ref 0.61–1.24)
GFR, Estimated: >60 mL/min (ref >60)
Glucose, Bld: 260 mg/dL — ABNORMAL HIGH (ref 70–99)
Potassium: 3.5 mmol/L (ref 3.5–5.1)
Sodium: 136 mmol/L (ref 135–145)
Total Bilirubin: 0.9 mg/dL (ref 0.3–1.2)
Total Protein: 6.7 g/dL (ref 6.5–8.1)

## 2021-07-30 LAB — URINALYSIS, ROUTINE W REFLEX MICROSCOPIC
Bacteria, UA: NONE SEEN
Bilirubin Urine: NEGATIVE
Glucose, UA: >=500 mg/dL — AB
Hgb urine dipstick: NEGATIVE
Ketones, ur: NEGATIVE mg/dL
Leukocytes,Ua: NEGATIVE
Nitrite: NEGATIVE
Protein, ur: 100 mg/dL — AB
Specific Gravity, Urine: 1.027 (ref 1.005–1.030)
pH: 5 (ref 5.0–8.0)

## 2021-07-30 LAB — CBC WITH DIFFERENTIAL/PLATELET
Abs Immature Granulocytes: 0.03 10*3/uL (ref 0.00–0.07)
Basophils Absolute: 0 10*3/uL (ref 0.0–0.1)
Basophils Relative: 1 %
Eosinophils Absolute: 0.1 10*3/uL (ref 0.0–0.5)
Eosinophils Relative: 3 %
HCT: 44.7 % (ref 39.0–52.0)
Hemoglobin: 15.4 g/dL (ref 13.0–17.0)
Immature Granulocytes: 1 %
Lymphocytes Relative: 31 %
Lymphs Abs: 1.6 10*3/uL (ref 0.7–4.0)
MCH: 29.8 pg (ref 26.0–34.0)
MCHC: 34.5 g/dL (ref 30.0–36.0)
MCV: 86.6 fL (ref 80.0–100.0)
Monocytes Absolute: 0.4 10*3/uL (ref 0.1–1.0)
Monocytes Relative: 8 %
Neutro Abs: 3 10*3/uL (ref 1.7–7.7)
Neutrophils Relative %: 56 %
Platelets: 201 10*3/uL (ref 150–400)
RBC: 5.16 MIL/uL (ref 4.22–5.81)
RDW: 12.7 % (ref 11.5–15.5)
WBC: 5.2 10*3/uL (ref 4.0–10.5)
nRBC: 0 % (ref 0.0–0.2)

## 2021-07-30 MED ORDER — METFORMIN HCL ER 500 MG PO TB24: 500.0000 mg | ORAL_TABLET | Freq: Every day | ORAL | 0 refills | Status: DC

## 2021-07-30 MED ORDER — HYDROCHLOROTHIAZIDE 25 MG PO TABS: 25.0000 mg | ORAL_TABLET | Freq: Every day | ORAL | 0 refills | Status: DC

## 2021-07-30 MED ORDER — AMLODIPINE BESYLATE 10 MG PO TABS: 10.0000 mg | ORAL_TABLET | Freq: Every day | ORAL | 0 refills | Status: DC

## 2021-07-30 NOTE — ED Triage Notes (Signed)
Patient sates the sole of his L foot is numb and has been tingling as well as his L arm x1 week. States he was pre-diabetic awhile ago and wants to be checked for DM. Reports increased urinary frequency at night. States he also needs BP meds.

## 2021-07-30 NOTE — ED Provider Notes (Signed)
[facility]-EMERGENCY DEPT Provider Note   CSN: [id number] Arrival date & time: 07/30/21  1126     History Chief Complaint  Patient presents with   foot numbness   hand numbness   Hypertension    Casey Garrett is a 55 y.o. male.  HPI    55 year old male comes in with multiple complaints.  He reports that over the last 2 weeks he has been having numbness to the left hand and plantar aspect of his left foot.  The numbness in his foot is dullness and the numbness in his left hand is described as tingling sensation.  Patient also reports that he needs refill for blood pressure medications.  He also thinks he is diabetic as he is having extra frequent urination.  He had been prescribed both hypertension and diabetes medication in the past from the ER, but he never was able to follow-up with PCP due to lack of insurance.  He has had poorly controlled blood sugar and blood pressure for at least 3 years now.  He denies any heavy smoking, drug use.  Denies any neck pain.  Also denies any focal weakness, slurred speech, vision change.  Past Medical History:  Diagnosis Date   Anxiety    Depression    Hypertension    Migraines     There are no problems to display for this patient.   History reviewed. No pertinent surgical history.     Family History  Problem Relation Age of Onset   Hypertension Mother     Social History   Tobacco Use   Smoking status: Former   Smokeless tobacco: Never  Building services engineer Use: Never used  Substance Use Topics   Alcohol use: Yes    Comment: "sometimes"   Drug use: Not Currently    Types: Marijuana    Home Medications Prior to Admission medications   Medication Sig Start Date End Date Taking? Authorizing Provider  hydrochlorothiazide (HYDRODIURIL) 25 MG tablet Take 1 tablet (25 mg total) by mouth daily. 07/30/21  Yes Derwood Kaplan, MD  metFORMIN (GLUCOPHAGE-XR) 500 MG 24 hr tablet Take 1 tablet (500 mg  total) by mouth daily with breakfast. 07/30/21  Yes Davon Folta, MD  albuterol (VENTOLIN HFA) 108 (90 Base) MCG/ACT inhaler Inhale 2 puffs into the lungs every 6 (six) hours as needed for wheezing or shortness of breath.    [provider]  amLODipine (NORVASC) 10 MG tablet Take 1 tablet (10 mg total) by mouth daily. 07/30/21   Derwood Kaplan, MD  Aspirin-Acetaminophen-Caffeine (GOODYS EXTRA STRENGTH) [phone] MG PACK Take 1 packet by mouth daily as needed (headache).    [provider]  benzonatate (TESSALON) 100 MG capsule Take 1 capsule (100 mg total) by mouth every 8 (eight) hours. Patient not taking: No sig reported 01/29/21   Caccavale, Sophia, PA-C  doxycycline (VIBRAMYCIN) 100 MG capsule Take 1 capsule (100 mg total) by mouth 2 (two) times daily. Patient not taking: No sig reported 01/20/21   Pollyann Savoy, MD  fluticasone [facility]) 50 MCG/ACT nasal spray Place 1 spray into both nostrils daily. Patient not taking: No sig reported 07/13/18   Raeford Razor, MD  losartan (COZAAR) 100 MG tablet Take 1 tablet (100 mg total) by mouth daily. 10/08/18 01/20/21  Nira Conn, MD    Allergies    Patient has no known allergies.  Review of Systems   Review of Systems  Constitutional:  Positive for activity change.  Respiratory:  Negative for shortness of breath.   Cardiovascular:  Negative for chest pain.  Genitourinary:  Positive for frequency.  Neurological:  Positive for numbness.  All other systems reviewed and are negative.  Physical Exam Updated Vital Signs BP (!) 169/120   Pulse 74   Temp 97.9 F (36.6 C) (Oral)   Resp 18   SpO2 98%   Physical Exam Vitals and nursing note reviewed.  Constitutional:      Appearance: He is well-developed.  HENT:     Head: Atraumatic.  Cardiovascular:     Rate and Rhythm: Normal rate.  Pulmonary:     Effort: Pulmonary effort is normal.  Musculoskeletal:     Cervical back: Neck supple.  Skin:    General:  Skin is warm.  Neurological:     Mental Status: He is alert and oriented to person, place, and time.     Cranial Nerves: No cranial nerve deficit.     Sensory: Sensory deficit present.     Motor: No weakness.     Comments: Subjective numbness to the plantar aspect of the left distal foot    ED Results / Procedures / Treatments   Labs (all labs ordered are listed, but only abnormal results are displayed) Labs Reviewed  URINALYSIS, ROUTINE W REFLEX MICROSCOPIC - Abnormal; Notable for the following components:      Result Value   Glucose, UA >=500 (*)    Protein, ur 100 (*)    All other components within normal limits  COMPREHENSIVE METABOLIC PANEL - Abnormal; Notable for the following components:   Glucose, Bld 260 (*)    Calcium 8.6 (*)    All other components within normal limits  CBC WITH DIFFERENTIAL/PLATELET    EKG EKG Interpretation  Date/Time:  Saturday July 30 2021 13:16:03 EST Ventricular Rate:  75 PR Interval:  214 QRS Duration: 164 QT Interval:  434 QTC Calculation: 485 R Axis:   100 Text Interpretation: Sinus rhythm Prolonged PR interval RBBB and LPFB No acute changes No significant change since last tracing Confirmed by Derwood Kaplan ([id number]) on 07/30/2021 2:59:40 PM  Radiology CT Head Wo Contrast  Result Date: 07/30/2021 CLINICAL DATA:  Acute neural deficit. EXAM: CT HEAD WITHOUT CONTRAST TECHNIQUE: Contiguous axial images were obtained from the base of the skull through the vertex without intravenous contrast. COMPARISON:  None. FINDINGS: Brain: No evidence of acute infarction, hemorrhage, hydrocephalus, extra-axial collection or mass lesion/mass effect. Vascular: No hyperdense vessel or unexpected calcification. Skull: Normal. Negative for fracture or focal lesion. Sinuses/Orbits: Diffuse mucosal thickening of the ethmoid sinuses. Other: None. IMPRESSION: 1. No acute intracranial abnormality. 2. Ethmoid sinusitis. Electronically Signed   By: Ted Mcalpine M.D.   On: 07/30/2021 13:34   MR BRAIN WO CONTRAST  Result Date: 07/30/2021 CLINICAL DATA:  Neuro deficit, acute, stroke suspected EXAM: MRI HEAD WITHOUT CONTRAST TECHNIQUE: Multiplanar, multiecho pulse sequences of the brain and surrounding structures were obtained without intravenous contrast. COMPARISON:  None. FINDINGS: There is distortion of signal related to patient's hair most notably on diffusion and susceptibility weighted imaging. Brain: There is no acute infarction or intracranial hemorrhage within above limitation. There is no intracranial mass, mass effect, or edema. There is no hydrocephalus or extra-axial fluid collection. Ventricles and sulci are normal in size and configuration. Patchy foci of T2 hyperintensity in the supratentorial white matter are nonspecific but may reflect mild chronic microvascular ischemic changes Vascular: Major vessel flow voids at the skull base are preserved. Skull and upper cervical spine:  Normal marrow signal is preserved. Sinuses/Orbits: Paranasal sinus mucosal thickening. Orbits are unremarkable. Other: Sella is partially empty.  Mastoid air cells are clear. IMPRESSION: No evidence of recent infarction, hemorrhage, or mass. Probable mild chronic microvascular ischemic changes. Electronically Signed   By: Guadlupe Spanish M.D.   On: 07/30/2021 15:59    Procedures Procedures   Medications Ordered in ED Medications - No data to display  ED Course  I have reviewed the triage vital signs and the nursing notes.  Pertinent labs & imaging results that were available during my care of the patient were reviewed by me and considered in my medical decision making (see chart for details).    MDM Rules/Calculators/A&P                           56 year old male comes in with chief complaint of left-sided numbness.  Symptoms have been present for about 2 weeks.  He is also requesting medication refill and noted to be significantly hypertensive.  Blood  work was ordered in the triage and is reassuring. CT scan of the head was fine.  No active bleed.  However given the subacute new neurologic symptoms, we will get an MRI to ensure there is no evidence of subacute stroke.  If the MRI is negative, then we will suspect that patient likely has neuropathy and have him follow-up with his outpatient doctors.  For his elevated blood pressure, we will start him on amlodipine and hydrochlorothiazide.  BP log instructions and instructions on how to measure the BP have been discussed with the patient.  He has not had anything to eat today and his sugar is over 200.  We will start him on metformin.  He has some leftover metformin from 3 years ago that we have advised him to not utilize  He now has insurance, and we have requested that he call his insurance company and figure out in Wellsite geologist to follow-up.  [facility] information also provided.  Final Clinical Impression(s) / ED Diagnoses Final diagnoses:  Primary hypertension  Elevated blood sugar  Neuropathy    Rx / DC Orders ED Discharge Orders          Ordered    amLODipine (NORVASC) 10 MG tablet  Daily        07/30/21 1624    hydrochlorothiazide (HYDRODIURIL) 25 MG tablet  Daily        07/30/21 1624    metFORMIN (GLUCOPHAGE-XR) 500 MG 24 hr tablet  Daily with breakfast        07/30/21 1624             Derwood Kaplan, MD 07/30/21 [phone]

## 2021-07-30 NOTE — ED Provider Notes (Signed)
Emergency Medicine Provider Triage Evaluation Note  Casey Garrett , a 55 y.o. male  was evaluated in triage.  Pt complains of increased frequency of urination, frothy urine, high blood pressure, numbness in the left foot and left arm without weakness.  Patient worried that there might be some neuropathy going on.  Patient has not been previously diagnosed with diabetes according to him, but reports that he is taken a couple of metformin that he had around the house.  Patient does not take anything for blood pressure.  Patient denies any confusion, balance issues, facial droop, difficulty walking.  Patient denies chest pain, shortness of breath but does report a radiating sensation going into his left arm like pins and needles.  Numbness in feet has been for the last 2 weeks, numbness in left arm has been only for the last 3 days.  Review of Systems  Positive: As above Negative: As above  Physical Exam  BP (!) 215/128 (BP Location: Left Arm)   Pulse 82   Temp 98.4 F (36.9 C) (Oral)   Resp 16   SpO2 97%  Gen:   Awake, no distress   Resp:  Normal effort MSK:   Moves extremities without difficulty  Other:  CN3-12 grossly intact, intact strength 5 out of 5 bilateral upper and lower extremities Romberg negative, gait normal.  Medical Decision Making  Medically screening exam initiated at 12:23 PM.  Appropriate orders placed.  Casey Garrett was informed that the remainder of the evaluation will be completed by another provider, this initial triage assessment does not replace that evaluation, and the importance of remaining in the ED until their evaluation is complete.  concern for high blood pressure, diabetes, possible focal neurodeficit although likely related to neuropathy    Olene Floss, PA-C 07/30/21 1225    Vanetta Mulders, MD 08/04/21 [phone]

## 2021-07-30 NOTE — Discharge Instructions (Signed)
Please call: Wellness or ideally, check with your insurance to figure out primary care physicians in your network -and set up an appointment with them.  We have prescribed you with 30 days of blood pressure and diabetes medications.  ER is not an optimal place for appropriate diagnosis or management of chronic conditions.  Provided is a log on blood pressure documentation.  Please, fill that form for at least couple of weeks before you see your primary care doctor.

## 2021-08-24 IMAGING — CR DG CHEST 2V
3 series · 3 of 3 positions shown · non-contrast
Comparison: December 17, 2020

CLINICAL DATA: Shortness of breath

EXAM:
CHEST - 2 VIEW

[w chest pa]
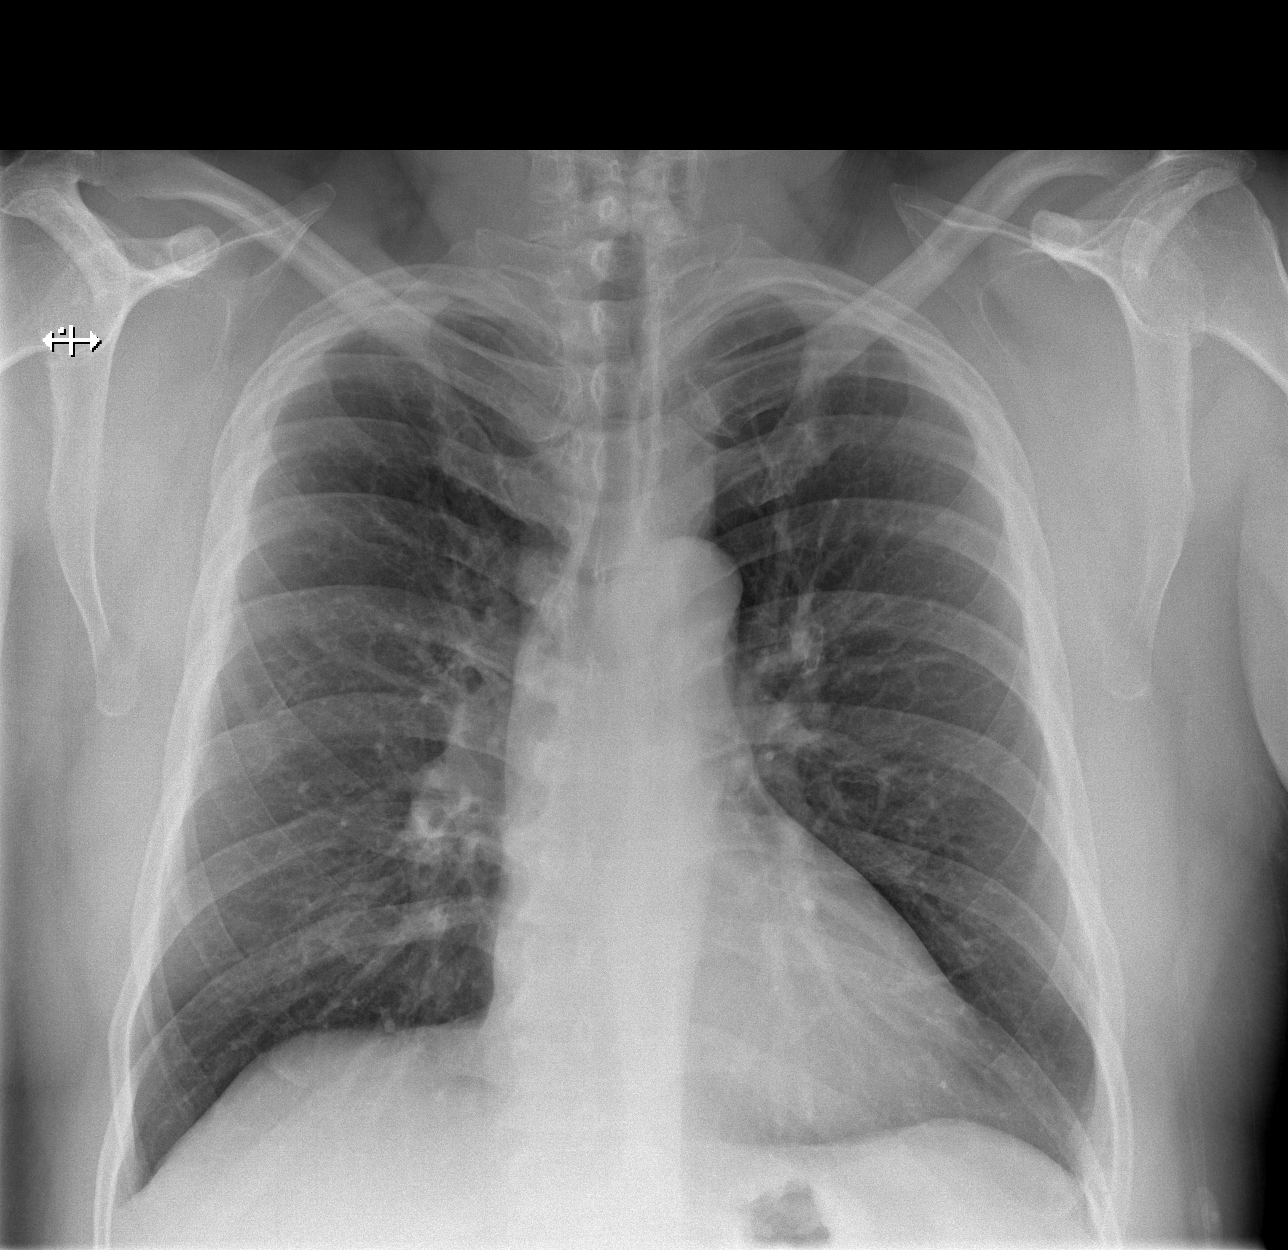

[w chest lat (1 of 2)]
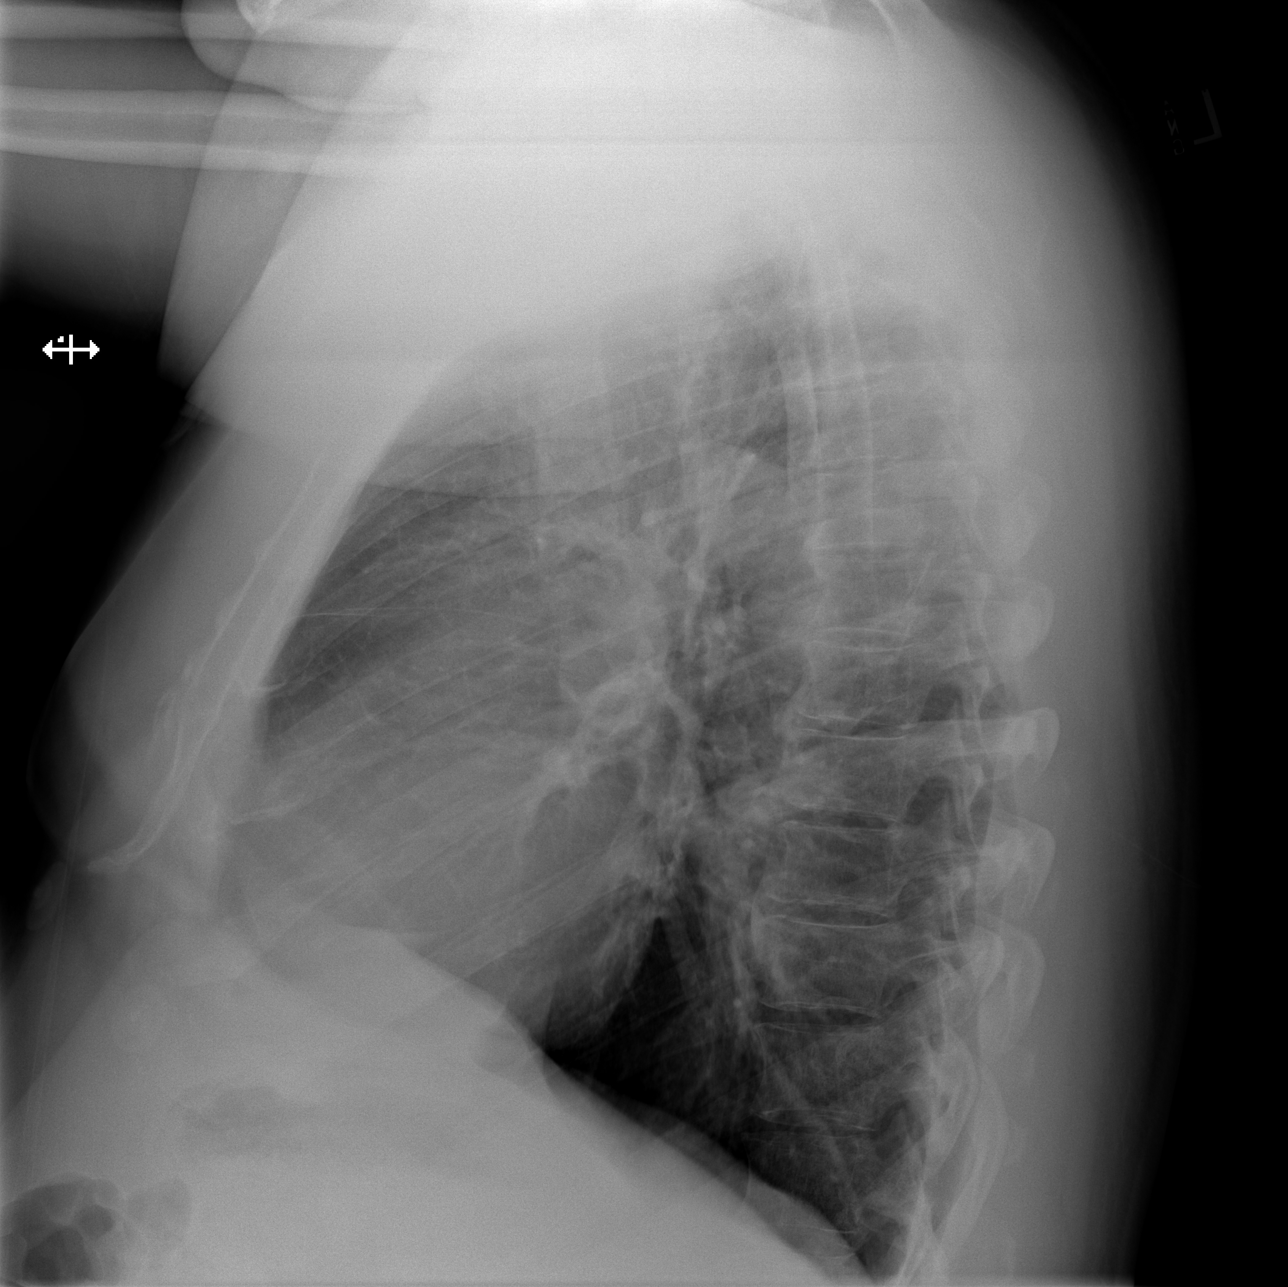

[w chest lat (2 of 2)]
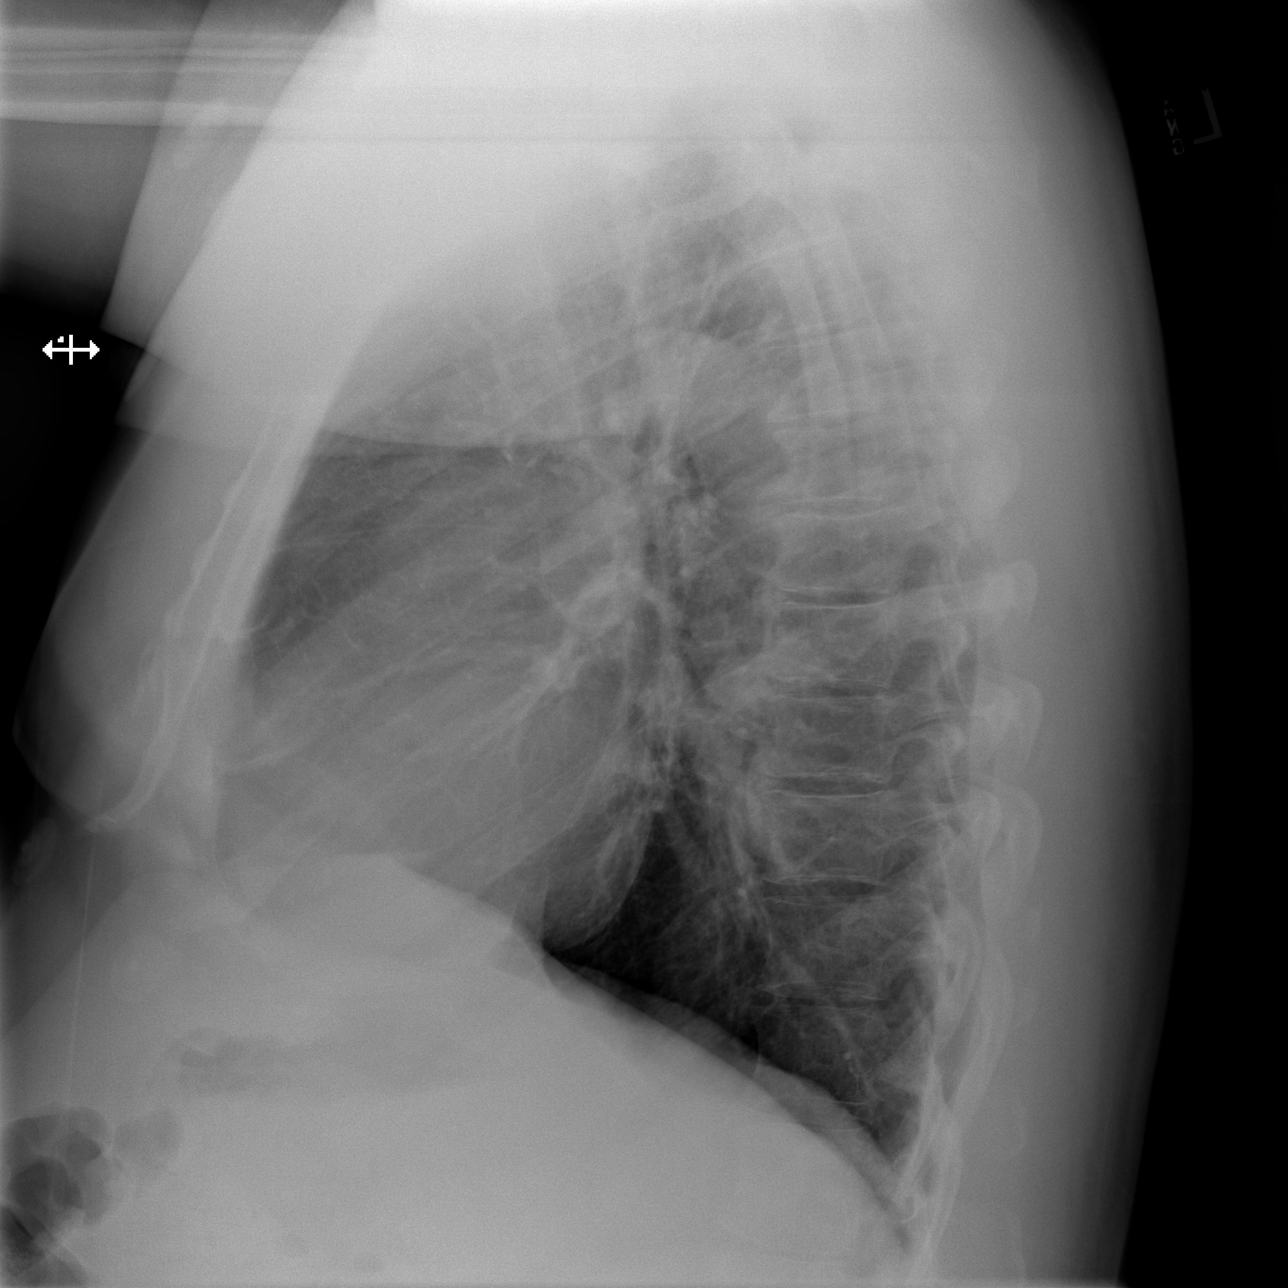

[3 of 3 positions shown; findings below may reference images not displayed]

FINDINGS: The heart size and mediastinal contours are within normal limits. No
focal consolidation. No pleural effusion. No pneumothorax. The
visualized skeletal structures are unremarkable.
IMPRESSION: No active cardiopulmonary disease.

## 2021-08-31 ENCOUNTER — Emergency Department (HOSPITAL_COMMUNITY)
Admission: EM | Admit: 2021-08-31 | Discharge: 2021-08-31 | Disposition: A | Discharge status: Home / Self Care | Payer: Medicaid Other | Attending: Emergency Medicine | Admitting: Emergency Medicine

## 2021-08-31 ENCOUNTER — Other Ambulatory Visit: Payer: Self-pay

## 2021-08-31 ENCOUNTER — Encounter (HOSPITAL_COMMUNITY): Payer: Self-pay | Admitting: Emergency Medicine

## 2021-08-31 DIAGNOSIS — Z79899 Other long term (current) drug therapy: Secondary | ICD-10-CM | POA: Insufficient documentation

## 2021-08-31 DIAGNOSIS — Z7984 Long term (current) use of oral hypoglycemic drugs: Secondary | ICD-10-CM | POA: Insufficient documentation

## 2021-08-31 DIAGNOSIS — I10 Essential (primary) hypertension: Secondary | ICD-10-CM | POA: Insufficient documentation

## 2021-08-31 DIAGNOSIS — E119 Type 2 diabetes mellitus without complications: Secondary | ICD-10-CM | POA: Insufficient documentation

## 2021-08-31 DIAGNOSIS — Z760 Encounter for issue of repeat prescription: Secondary | ICD-10-CM | POA: Insufficient documentation

## 2021-08-31 MED ORDER — HYDROCHLOROTHIAZIDE 25 MG PO TABS: 25.0000 mg | ORAL_TABLET | Freq: Every day | ORAL | 0 refills | Status: DC

## 2021-08-31 MED ORDER — METFORMIN HCL ER 500 MG PO TB24: 500.0000 mg | ORAL_TABLET | Freq: Every day | ORAL | 0 refills | Status: DC

## 2021-08-31 MED ORDER — AMLODIPINE BESYLATE 10 MG PO TABS: 10.0000 mg | ORAL_TABLET | Freq: Every day | ORAL | 0 refills | Status: DC

## 2021-08-31 NOTE — ED Provider Notes (Signed)
[facility]-EMERGENCY DEPT Provider Note   CSN: [id number] Arrival date & time: 08/31/21  1827     History  Chief Complaint  Patient presents with   Medication Refill    Casey Garrett is a 56 y.o. male.  56 year old male presents emergency room with request for refill of his metformin blood pressure medications.  Patient was last in the ER 1 month ago for same, given medications and advised follow-up with primary care.  Patient states that his insurance has since changed and he is waiting to hear back from his insurance which should be tomorrow with who his new PCP will be.  He denies complaints today otherwise.      Home Medications Prior to Admission medications   Medication Sig Start Date End Date Taking? Authorizing Provider  albuterol (VENTOLIN HFA) 108 (90 Base) MCG/ACT inhaler Inhale 2 puffs into the lungs every 6 (six) hours as needed for wheezing or shortness of breath.    [provider]  amLODipine (NORVASC) 10 MG tablet Take 1 tablet (10 mg total) by mouth daily for 14 days. 08/31/21 09/14/21  Jeannie Fend, PA-C  Aspirin-Acetaminophen-Caffeine (GOODYS EXTRA STRENGTH) [phone] MG PACK Take 1 packet by mouth daily as needed (headache).    [provider]  benzonatate (TESSALON) 100 MG capsule Take 1 capsule (100 mg total) by mouth every 8 (eight) hours. Patient not taking: No sig reported 01/29/21   Caccavale, Sophia, PA-C  doxycycline (VIBRAMYCIN) 100 MG capsule Take 1 capsule (100 mg total) by mouth 2 (two) times daily. Patient not taking: No sig reported 01/20/21   Pollyann Savoy, MD  fluticasone [facility]) 50 MCG/ACT nasal spray Place 1 spray into both nostrils daily. Patient not taking: No sig reported 07/13/18   Raeford Razor, MD  hydrochlorothiazide (HYDRODIURIL) 25 MG tablet Take 1 tablet (25 mg total) by mouth daily for 14 days. 08/31/21 09/14/21  Jeannie Fend, PA-C  metFORMIN (GLUCOPHAGE-XR) 500 MG 24 hr tablet Take 1  tablet (500 mg total) by mouth daily with breakfast for 14 days. 08/31/21 09/14/21  Jeannie Fend, PA-C  losartan (COZAAR) 100 MG tablet Take 1 tablet (100 mg total) by mouth daily. 10/08/18 01/20/21  Nira Conn, MD      Allergies    Patient has no known allergies.    Review of Systems   Review of Systems  Constitutional:  Negative for fever.  Respiratory:  Negative for shortness of breath.   Cardiovascular:  Negative for chest pain.  Gastrointestinal:  Negative for abdominal pain.  Skin:  Negative for wound.  Allergic/Immunologic: Positive for immunocompromised state.  Neurological:  Negative for weakness.   Physical Exam Updated Vital Signs BP (!) 178/107 (BP Location: Right Arm)    Pulse 88    Temp 98.6 F (37 C) (Oral)    Resp 16    Ht 6' (1.829 m)    Wt 102.5 kg    SpO2 96%    BMI 30.65 kg/m  Physical Exam Vitals and nursing note reviewed.  Constitutional:      General: He is not in acute distress.    Appearance: He is well-developed. He is not diaphoretic.  HENT:     Head: Normocephalic and atraumatic.  Cardiovascular:     Rate and Rhythm: Normal rate and regular rhythm.     Heart sounds: Normal heart sounds.  Pulmonary:     Effort: Pulmonary effort is normal.     Breath sounds: Normal breath sounds.  Skin:    General: Skin is warm and dry.     Findings: No erythema or rash.  Neurological:     Mental Status: He is alert and oriented to person, place, and time.  Psychiatric:        Behavior: Behavior normal.    ED Results / Procedures / Treatments   Labs (all labs ordered are listed, but only abnormal results are displayed) Labs Reviewed - No data to display  EKG None  Radiology No results found.  Procedures Procedures    Medications Ordered in ED Medications - No data to display  ED Course/ Medical Decision Making/ A&P Clinical Course as of 08/31/21 2333  Wed Aug 31, 2021  [id number] 56 year old male with history of hypertension and diabetes  presents emergency room after having run out of his medications.  Last seen in the emergency room 1 month ago, medications were refilled at that time with plan for patient to follow-up with his PCP.  Patient states that similar to his prior presentation, he has not had a change in insurance again and is awaiting assignment of a PCP from his insurance company.  Patient is given resource guide for primary care provider should he not hear back from his insurance company provider tomorrow as expected.  He is advised that medication refills emergency department are not appropriate, given 2-week course of his medications and advised he will need to follow-up with primary care for additional medications. His labs from his prior ER visit are reviewed prior to refilling his medications. [LM]    Clinical Course User Index [LM] Jeannie Fend, PA-C                           Medical Decision Making         Final Clinical Impression(s) / ED Diagnoses Final diagnoses:  Medication refill    Rx / DC Orders ED Discharge Orders          Ordered    hydrochlorothiazide (HYDRODIURIL) 25 MG tablet  Daily        08/31/21 1935    metFORMIN (GLUCOPHAGE-XR) 500 MG 24 hr tablet  Daily with breakfast        08/31/21 1935    amLODipine (NORVASC) 10 MG tablet  Daily        08/31/21 1935              Alden Hipp 08/31/21 2333    Sloan Leiter, DO 09/01/21 [phone]

## 2021-08-31 NOTE — ED Triage Notes (Signed)
Patient reports he ran out of his metformin and BP meds and would like a refill of everything. Reports the sole of his foot is still numb as well.

## 2021-10-11 IMAGING — CR DG CHEST 2V
2 series · 2 of 2 positions shown · non-contrast
Comparison: Radiograph 01/29/2021

CLINICAL DATA: Chest pain.  Syncope.

EXAM:
CHEST - 2 VIEW

[w chest pa]
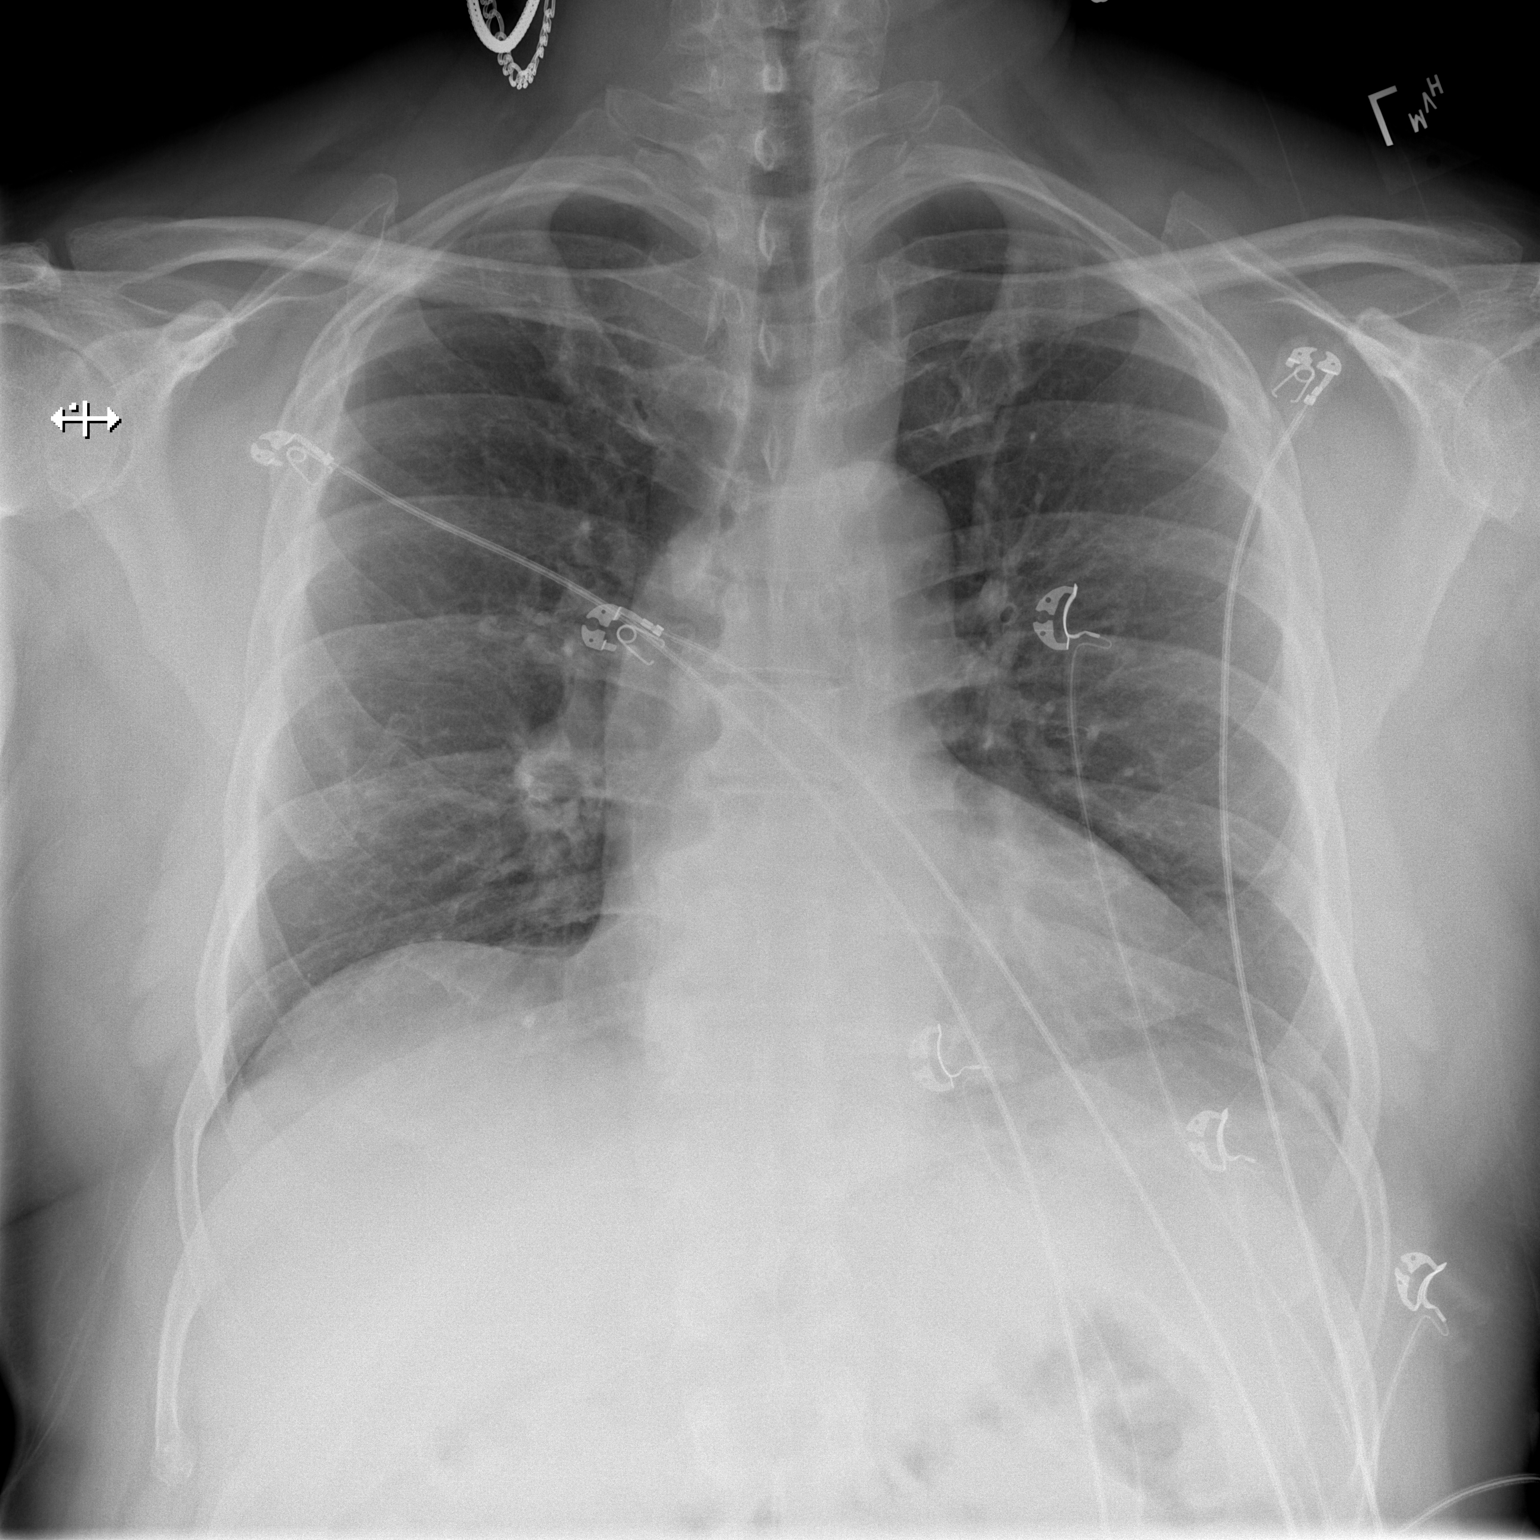

[w chest lat]
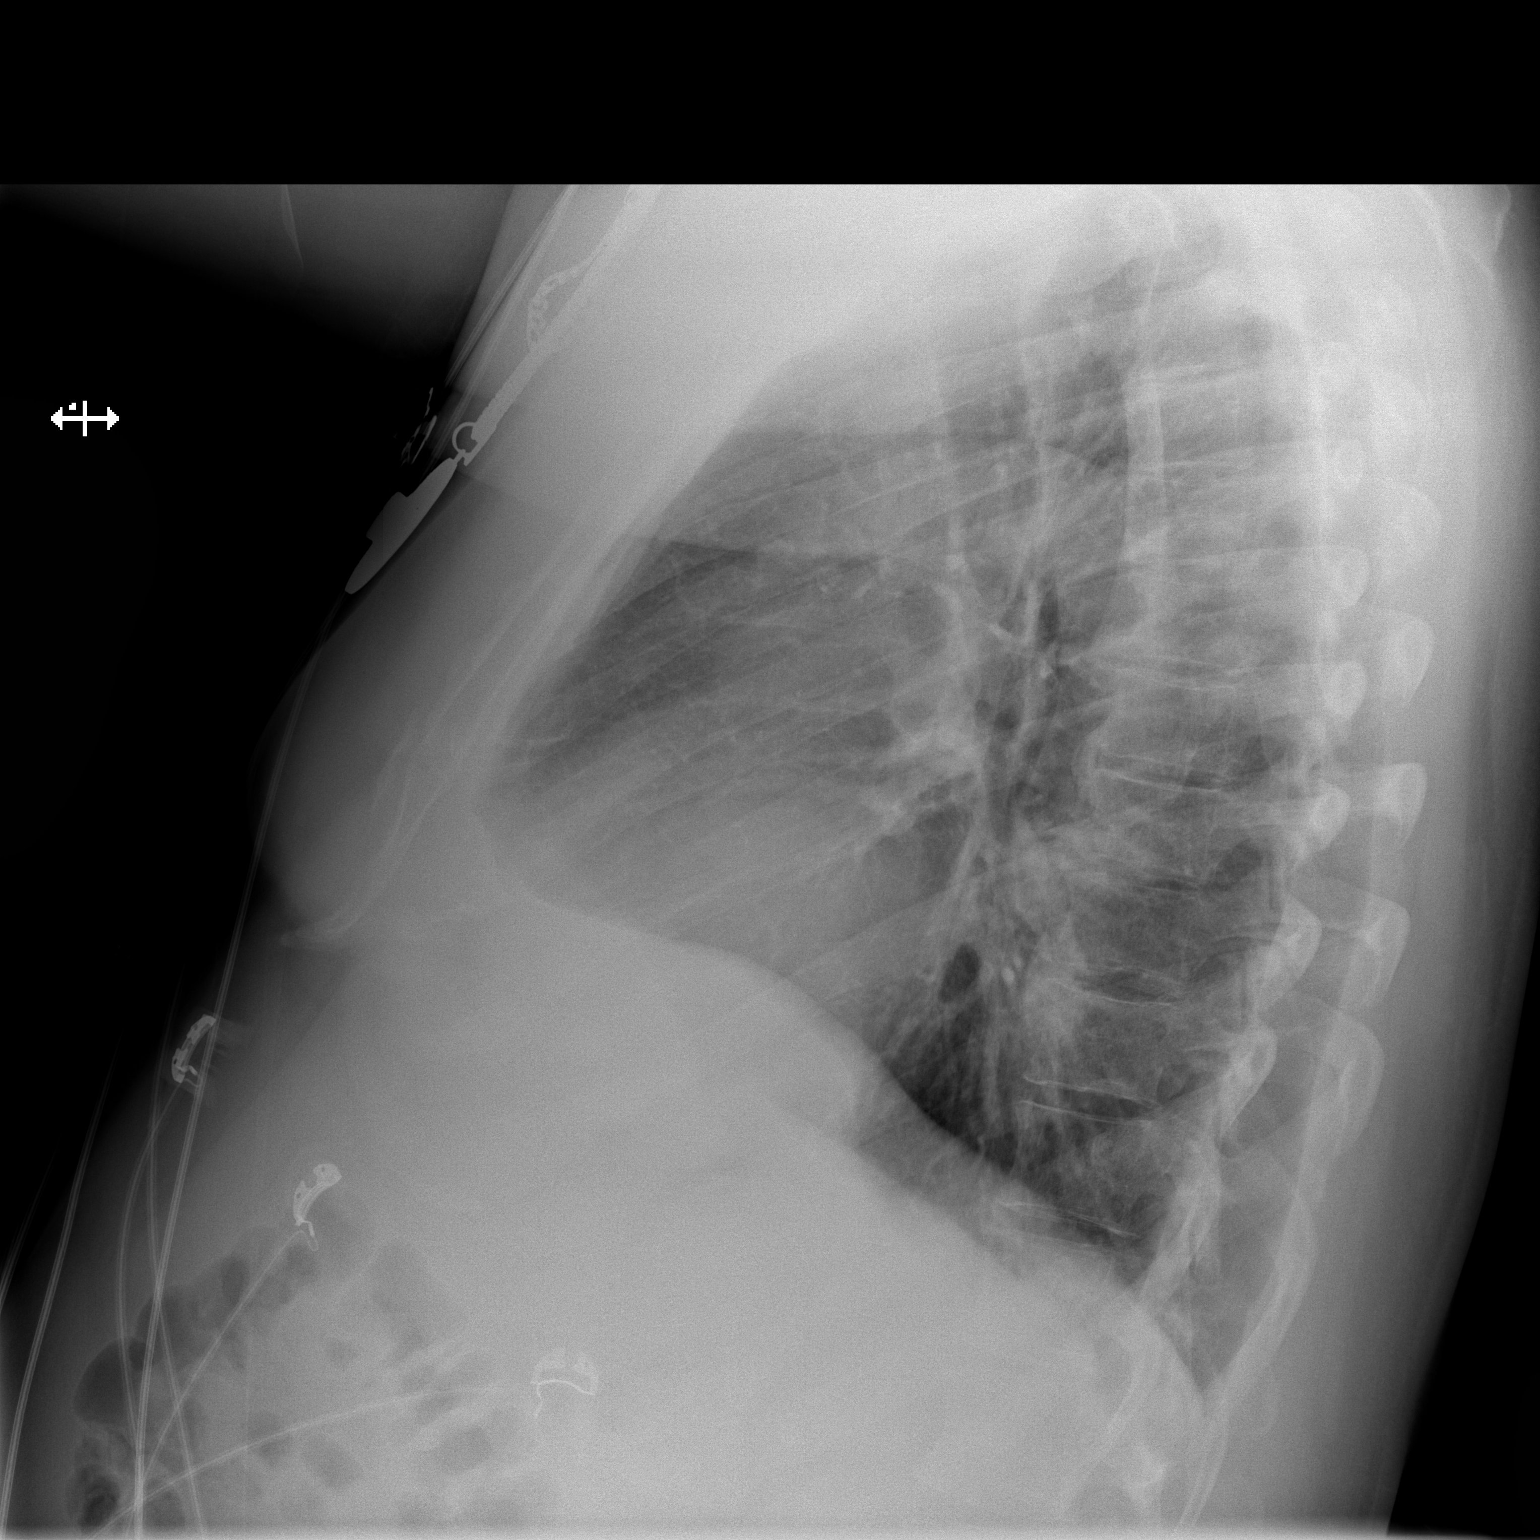

[2 of 2 positions shown; findings below may reference images not displayed]

FINDINGS: The cardiomediastinal contours are normal. The lungs are clear.
Pulmonary vasculature is normal. No consolidation, pleural effusion,
or pneumothorax. No acute osseous abnormalities are seen.
IMPRESSION: No acute pulmonary process.

## 2021-12-06 ENCOUNTER — Emergency Department (HOSPITAL_COMMUNITY): Payer: Self-pay

## 2021-12-06 ENCOUNTER — Encounter (HOSPITAL_COMMUNITY): Payer: Self-pay | Admitting: Emergency Medicine

## 2021-12-06 ENCOUNTER — Inpatient Hospital Stay (HOSPITAL_COMMUNITY)
Admission: EM | Admit: 2021-12-06 | Discharge: 2021-12-10 | DRG: 871 | Disposition: A | Discharge status: Home / Self Care | Payer: Self-pay | Attending: Internal Medicine | Admitting: Internal Medicine

## 2021-12-06 ENCOUNTER — Other Ambulatory Visit: Payer: Self-pay

## 2021-12-06 DIAGNOSIS — E872 Acidosis, unspecified: Secondary | ICD-10-CM | POA: Diagnosis present

## 2021-12-06 DIAGNOSIS — R7989 Other specified abnormal findings of blood chemistry: Secondary | ICD-10-CM | POA: Diagnosis present

## 2021-12-06 DIAGNOSIS — F1210 Cannabis abuse, uncomplicated: Secondary | ICD-10-CM | POA: Diagnosis present

## 2021-12-06 DIAGNOSIS — Z6841 Body Mass Index (BMI) 40.0 and over, adult: Secondary | ICD-10-CM

## 2021-12-06 DIAGNOSIS — E119 Type 2 diabetes mellitus without complications: Secondary | ICD-10-CM | POA: Diagnosis present

## 2021-12-06 DIAGNOSIS — A419 Sepsis, unspecified organism: Secondary | ICD-10-CM

## 2021-12-06 DIAGNOSIS — F1510 Other stimulant abuse, uncomplicated: Secondary | ICD-10-CM | POA: Diagnosis present

## 2021-12-06 DIAGNOSIS — Z8249 Family history of ischemic heart disease and other diseases of the circulatory system: Secondary | ICD-10-CM

## 2021-12-06 DIAGNOSIS — E1169 Type 2 diabetes mellitus with other specified complication: Secondary | ICD-10-CM | POA: Diagnosis present

## 2021-12-06 DIAGNOSIS — G9341 Metabolic encephalopathy: Secondary | ICD-10-CM | POA: Diagnosis present

## 2021-12-06 DIAGNOSIS — R6521 Severe sepsis with septic shock: Secondary | ICD-10-CM

## 2021-12-06 DIAGNOSIS — E860 Dehydration: Secondary | ICD-10-CM | POA: Diagnosis present

## 2021-12-06 DIAGNOSIS — N390 Urinary tract infection, site not specified: Secondary | ICD-10-CM | POA: Diagnosis present

## 2021-12-06 DIAGNOSIS — N179 Acute kidney failure, unspecified: Secondary | ICD-10-CM

## 2021-12-06 DIAGNOSIS — B961 Klebsiella pneumoniae [K. pneumoniae] as the cause of diseases classified elsewhere: Secondary | ICD-10-CM | POA: Diagnosis present

## 2021-12-06 DIAGNOSIS — Z7984 Long term (current) use of oral hypoglycemic drugs: Secondary | ICD-10-CM

## 2021-12-06 DIAGNOSIS — E876 Hypokalemia: Secondary | ICD-10-CM | POA: Diagnosis present

## 2021-12-06 DIAGNOSIS — F1910 Other psychoactive substance abuse, uncomplicated: Secondary | ICD-10-CM | POA: Diagnosis present

## 2021-12-06 DIAGNOSIS — I10 Essential (primary) hypertension: Secondary | ICD-10-CM | POA: Diagnosis present

## 2021-12-06 DIAGNOSIS — R7401 Elevation of levels of liver transaminase levels: Secondary | ICD-10-CM | POA: Diagnosis present

## 2021-12-06 DIAGNOSIS — R6520 Severe sepsis without septic shock: Secondary | ICD-10-CM

## 2021-12-06 DIAGNOSIS — Z87891 Personal history of nicotine dependence: Secondary | ICD-10-CM

## 2021-12-06 DIAGNOSIS — N170 Acute kidney failure with tubular necrosis: Secondary | ICD-10-CM | POA: Diagnosis present

## 2021-12-06 DIAGNOSIS — K56609 Unspecified intestinal obstruction, unspecified as to partial versus complete obstruction: Principal | ICD-10-CM

## 2021-12-06 DIAGNOSIS — A4159 Other Gram-negative sepsis: Principal | ICD-10-CM | POA: Diagnosis present

## 2021-12-06 DIAGNOSIS — Z79899 Other long term (current) drug therapy: Secondary | ICD-10-CM

## 2021-12-06 DIAGNOSIS — Z20822 Contact with and (suspected) exposure to covid-19: Secondary | ICD-10-CM | POA: Diagnosis present

## 2021-12-06 LAB — ACETAMINOPHEN LEVEL: Acetaminophen (Tylenol), Serum: <10 ug/mL — ABNORMAL LOW (ref 10–30)

## 2021-12-06 LAB — I-STAT VENOUS BLOOD GAS, ED
Acid-base deficit: 1 mmol/L (ref 0.0–2.0)
Bicarbonate: 20.7 mmol/L (ref 20.0–28.0)
Calcium, Ion: 1.01 mmol/L — ABNORMAL LOW (ref 1.15–1.40)
HCT: 49 % (ref 39.0–52.0)
Hemoglobin: 16.7 g/dL (ref 13.0–17.0)
O2 Saturation: 88 %
Potassium: 3.2 mmol/L — ABNORMAL LOW (ref 3.5–5.1)
Sodium: 137 mmol/L (ref 135–145)
TCO2: 21 mmol/L — ABNORMAL LOW (ref 22–32)
pCO2, Ven: 27.4 mmHg — ABNORMAL LOW (ref 44–60)
pH, Ven: 7.485 — ABNORMAL HIGH (ref 7.25–7.43)
pO2, Ven: 49 mmHg — ABNORMAL HIGH (ref 32–45)

## 2021-12-06 LAB — CBC WITH DIFFERENTIAL/PLATELET
Abs Immature Granulocytes: 0 10*3/uL (ref 0.00–0.07)
Basophils Absolute: 0 10*3/uL (ref 0.0–0.1)
Basophils Relative: 0 %
Eosinophils Absolute: 0.1 10*3/uL (ref 0.0–0.5)
Eosinophils Relative: 1 %
HCT: 49.4 % (ref 39.0–52.0)
Hemoglobin: 16.6 g/dL (ref 13.0–17.0)
Lymphocytes Relative: 7 %
Lymphs Abs: 0.9 10*3/uL (ref 0.7–4.0)
MCH: 29.4 pg (ref 26.0–34.0)
MCHC: 33.6 g/dL (ref 30.0–36.0)
MCV: 87.6 fL (ref 80.0–100.0)
Monocytes Absolute: 0 10*3/uL — ABNORMAL LOW (ref 0.1–1.0)
Monocytes Relative: 0 %
Neutro Abs: 11.5 10*3/uL — ABNORMAL HIGH (ref 1.7–7.7)
Neutrophils Relative %: 92 %
Platelets: 165 10*3/uL (ref 150–400)
RBC: 5.64 MIL/uL (ref 4.22–5.81)
RDW: 13.2 % (ref 11.5–15.5)
WBC: 12.5 10*3/uL — ABNORMAL HIGH (ref 4.0–10.5)
nRBC: 0.4 % — ABNORMAL HIGH (ref 0.0–0.2)
nRBC: 2 /100 WBC — ABNORMAL HIGH

## 2021-12-06 LAB — COMPREHENSIVE METABOLIC PANEL
ALT: 478 U/L — ABNORMAL HIGH (ref 0–44)
AST: 899 U/L — ABNORMAL HIGH (ref 15–41)
Albumin: 3.2 g/dL — ABNORMAL LOW (ref 3.5–5.0)
Alkaline Phosphatase: 122 U/L (ref 38–126)
Anion gap: 20 — ABNORMAL HIGH (ref 5–15)
BUN: 29 mg/dL — ABNORMAL HIGH (ref 6–20)
CO2: 19 mmol/L — ABNORMAL LOW (ref 22–32)
Calcium: 8.9 mg/dL (ref 8.9–10.3)
Chloride: 99 mmol/L (ref 98–111)
Creatinine, Ser: 4.44 mg/dL — ABNORMAL HIGH (ref 0.61–1.24)
GFR, Estimated: 15 mL/min — ABNORMAL LOW (ref >60)
Glucose, Bld: 165 mg/dL — ABNORMAL HIGH (ref 70–99)
Potassium: 3.3 mmol/L — ABNORMAL LOW (ref 3.5–5.1)
Sodium: 138 mmol/L (ref 135–145)
Total Bilirubin: 1.2 mg/dL (ref 0.3–1.2)
Total Protein: 6.3 g/dL — ABNORMAL LOW (ref 6.5–8.1)

## 2021-12-06 LAB — PROTIME-INR
INR: 1.2 (ref 0.8–1.2)
Prothrombin Time: 15.2 seconds (ref 11.4–15.2)

## 2021-12-06 LAB — RESP PANEL BY RT-PCR (FLU A&B, COVID) ARPGX2
Influenza A by PCR: NEGATIVE
Influenza B by PCR: NEGATIVE
SARS Coronavirus 2 by RT PCR: NEGATIVE

## 2021-12-06 LAB — CBC
HCT: 45.9 % (ref 39.0–52.0)
Hemoglobin: 15.7 g/dL (ref 13.0–17.0)
MCH: 29.9 pg (ref 26.0–34.0)
MCHC: 34.2 g/dL (ref 30.0–36.0)
MCV: 87.4 fL (ref 80.0–100.0)
Platelets: 112 10*3/uL — ABNORMAL LOW (ref 150–400)
RBC: 5.25 MIL/uL (ref 4.22–5.81)
RDW: 13.2 % (ref 11.5–15.5)
WBC: 13.2 10*3/uL — ABNORMAL HIGH (ref 4.0–10.5)
nRBC: 0.2 % (ref 0.0–0.2)

## 2021-12-06 LAB — TSH: TSH: 3.8 u[IU]/mL (ref 0.350–4.500)

## 2021-12-06 LAB — LACTIC ACID, PLASMA
Lactic Acid, Venous: 7.6 mmol/L (ref 0.5–1.9)
Lactic Acid, Venous: 6.3 mmol/L (ref 0.5–1.9)
Lactic Acid, Venous: 4.7 mmol/L (ref 0.5–1.9)

## 2021-12-06 LAB — ETHANOL: Alcohol, Ethyl (B): <10 mg/dL (ref <10)

## 2021-12-06 LAB — APTT: aPTT: 27 seconds (ref 24–36)

## 2021-12-06 LAB — SALICYLATE LEVEL: Salicylate Lvl: <7 mg/dL — ABNORMAL LOW (ref 7.0–30.0)

## 2021-12-06 LAB — AMMONIA: Ammonia: 29 umol/L (ref 9–35)

## 2021-12-06 LAB — CREATININE, SERUM
Creatinine, Ser: 4.92 mg/dL — ABNORMAL HIGH (ref 0.61–1.24)
GFR, Estimated: 13 mL/min — ABNORMAL LOW (ref >60)

## 2021-12-06 LAB — LIPASE, BLOOD: Lipase: 25 U/L (ref 11–51)

## 2021-12-06 MED ORDER — LACTATED RINGERS IV BOLUS (SEPSIS)
500.0000 mL | Freq: Once | INTRAVENOUS | Status: AC
  Administered 2021-12-06: 500 mL via INTRAVENOUS

## 2021-12-06 MED ORDER — LACTATED RINGERS IV BOLUS (SEPSIS)
1000.0000 mL | Freq: Once | INTRAVENOUS | Status: AC
  Administered 2021-12-06: 1000 mL via INTRAVENOUS

## 2021-12-06 MED ORDER — HEPARIN SODIUM (PORCINE) 5000 UNIT/ML IJ SOLN
5000.0000 [IU] | Freq: Three times a day (TID) | INTRAMUSCULAR | Status: DC
  Administered 2021-12-06 – 2021-12-10 (×10): 5000 [IU] via SUBCUTANEOUS
  Filled 2021-12-06 (×10): qty 1

## 2021-12-06 MED ORDER — PANTOPRAZOLE SODIUM 40 MG IV SOLR
40.0000 mg | Freq: Two times a day (BID) | INTRAVENOUS | Status: DC
  Administered 2021-12-06 – 2021-12-07 (×2): 40 mg via INTRAVENOUS
  Filled 2021-12-06 (×2): qty 10

## 2021-12-06 MED ORDER — SODIUM CHLORIDE 0.9 % IV SOLN
2.0000 g | Freq: Once | INTRAVENOUS | Status: AC
  Administered 2021-12-06: 2 g via INTRAVENOUS
  Filled 2021-12-06: qty 12.5

## 2021-12-06 MED ORDER — LACTATED RINGERS IV SOLN: INTRAVENOUS | Status: DC

## 2021-12-06 MED ORDER — VANCOMYCIN VARIABLE DOSE PER UNSTABLE RENAL FUNCTION (PHARMACIST DOSING): Status: DC

## 2021-12-06 MED ORDER — VANCOMYCIN HCL IN DEXTROSE 1-5 GM/200ML-% IV SOLN: 1000.0000 mg | Freq: Once | INTRAVENOUS | Status: DC

## 2021-12-06 MED ORDER — VANCOMYCIN HCL 2000 MG/400ML IV SOLN
2000.0000 mg | Freq: Once | INTRAVENOUS | Status: AC
  Administered 2021-12-06: 2000 mg via INTRAVENOUS
  Filled 2021-12-06: qty 400

## 2021-12-06 MED ORDER — DOCUSATE SODIUM 100 MG PO CAPS
100.0000 mg | ORAL_CAPSULE | Freq: Two times a day (BID) | ORAL | Status: DC | PRN
Start: 2021-12-06 — End: 2021-12-10

## 2021-12-06 MED ORDER — METRONIDAZOLE 500 MG/100ML IV SOLN
500.0000 mg | Freq: Once | INTRAVENOUS | Status: AC
  Administered 2021-12-06: 500 mg via INTRAVENOUS
  Filled 2021-12-06: qty 100

## 2021-12-06 MED ORDER — POLYETHYLENE GLYCOL 3350 17 G PO PACK: 17.0000 g | PACK | Freq: Every day | ORAL | Status: DC | PRN

## 2021-12-06 MED ORDER — SODIUM CHLORIDE 0.9 % IV SOLN: 2.0000 g | INTRAVENOUS | Status: DC

## 2021-12-06 MED ORDER — CHLORHEXIDINE GLUCONATE CLOTH 2 % EX PADS
6.0000 | MEDICATED_PAD | Freq: Every day | CUTANEOUS | Status: DC
  Administered 2021-12-07 – 2021-12-08 (×2): 6 via TOPICAL

## 2021-12-06 NOTE — Progress Notes (Signed)
Attempted to call for report. Unable to reach the nurse caring for pt. Will attempt again.  ?

## 2021-12-06 NOTE — ED Notes (Signed)
Lactic acid 7.6 per lab ?

## 2021-12-06 NOTE — ED Provider Notes (Signed)
?[facility] ?Provider Note ? ? ?CSN: [id number] ?Arrival date & time: 12/06/21  1825 ? ?  ? ?History ? ?Chief Complaint  ?Patient presents with  ? Altered Mental Status  ? ? ?Casey LukesKeith B Roller is a 56 y.o. male. ? ?The history is provided by the EMS personnel, medical records and a relative. The history is limited by the condition of the patient.  ?Altered Mental Status ?Presenting symptoms: confusion and partial responsiveness   ?Severity:  Severe ?Most recent episode:  Today ?Episode history:  Continuous ?Timing:  Constant ?Progression:  Unchanged ?Chronicity:  New ?Associated symptoms: abdominal pain, fever, nausea and vomiting   ?Associated symptoms: no agitation and no headaches   ? ?LVL 5 Caveat for AMS ? ?  ? ?Home Medications ?Prior to Admission medications   ?Medication Sig Start Date End Date Taking? Authorizing Provider  ?albuterol (VENTOLIN HFA) 108 (90 Base) MCG/ACT inhaler Inhale 2 puffs into the lungs every 6 (six) hours as needed for wheezing or shortness of breath.    [provider]  ?amLODipine (NORVASC) 10 MG tablet Take 1 tablet (10 mg total) by mouth daily for 14 days. 08/31/21 09/14/21  Jeannie FendMurphy, Laura A, PA-C  ?Aspirin-Acetaminophen-Caffeine (GOODYS EXTRA STRENGTH) 500-325-65 MG PACK Take 1 packet by mouth daily as needed (headache).    [provider]  ?benzonatate (TESSALON) 100 MG capsule Take 1 capsule (100 mg total) by mouth every 8 (eight) hours. ?Patient not taking: No sig reported 01/29/21   Caccavale, Sophia, PA-C  ?doxycycline (VIBRAMYCIN) 100 MG capsule Take 1 capsule (100 mg total) by mouth 2 (two) times daily. ?Patient not taking: No sig reported 01/20/21   Pollyann SavoySheldon, Charles B, MD  ?fluticasone [facility](FLONASE) 50 MCG/ACT nasal spray Place 1 spray into both nostrils daily. ?Patient not taking: No sig reported 07/13/18   Raeford RazorKohut, Stephen, MD  ?hydrochlorothiazide (HYDRODIURIL) 25 MG tablet Take 1 tablet (25 mg total) by mouth daily for 14  days. 08/31/21 09/14/21  Jeannie FendMurphy, Laura A, PA-C  ?metFORMIN (GLUCOPHAGE-XR) 500 MG 24 hr tablet Take 1 tablet (500 mg total) by mouth daily with breakfast for 14 days. 08/31/21 09/14/21  Jeannie FendMurphy, Laura A, PA-C  ?losartan (COZAAR) 100 MG tablet Take 1 tablet (100 mg total) by mouth daily. 10/08/18 01/20/21  Nira Connardama, Pedro Eduardo, MD  ?   ? ?Allergies    ?Patient has no known allergies.   ? ?Review of Systems   ?Review of Systems  ?Unable to perform ROS: Mental status change  ?Constitutional:  Positive for chills and fever.  ?Respiratory:  Positive for cough. Negative for shortness of breath.   ?Cardiovascular:  Negative for chest pain.  ?Gastrointestinal:  Positive for abdominal pain, nausea and vomiting. Negative for constipation and diarrhea.  ?Genitourinary:  Negative for dysuria.  ?Musculoskeletal:  Negative for back pain.  ?Neurological:  Negative for headaches.  ?Psychiatric/Behavioral:  Positive for confusion. Negative for agitation.   ? ?Physical Exam ?Updated Vital Signs ?BP 102/70 (BP Location: Left Arm)   Pulse (!) 140   Temp (!) 103.7 ?F (39.8 ?C) (Oral)   Resp (!) 24   SpO2 94%  ?Physical Exam ?Vitals and nursing note reviewed.  ?Constitutional:   ?   General: He is in acute distress.  ?   Appearance: He is well-developed. He is ill-appearing.  ?HENT:  ?   Head: Normocephalic and atraumatic.  ?   Nose: No congestion.  ?   Mouth/Throat:  ?   Mouth: Mucous membranes are dry.  ?  Pharynx: No oropharyngeal exudate or posterior oropharyngeal erythema.  ?Eyes:  ?   Extraocular Movements: Extraocular movements intact.  ?   Conjunctiva/sclera: Conjunctivae normal.  ?   Pupils: Pupils are equal, round, and reactive to light.  ?Cardiovascular:  ?   Rate and Rhythm: Regular rhythm. Tachycardia present.  ?   Heart sounds: No murmur heard. ?Pulmonary:  ?   Effort: No respiratory distress.  ?   Breath sounds: Rhonchi present. No rales.  ?Chest:  ?   Chest wall: No tenderness.  ?Abdominal:  ?   General: Abdomen is flat.  ?    Palpations: Abdomen is soft.  ?   Tenderness: There is abdominal tenderness.  ?Musculoskeletal:     ?   General: No swelling.  ?   Cervical back: Neck supple. No tenderness.  ?Skin: ?   General: Skin is warm and dry.  ?   Capillary Refill: Capillary refill takes less than 2 seconds.  ?   Findings: No erythema or rash.  ?Neurological:  ?   Mental Status: He is confused.  ?   GCS: GCS eye subscore is 4. GCS verbal subscore is 2. GCS motor subscore is 6.  ?   Cranial Nerves: No facial asymmetry.  ?   Motor: No weakness.  ? ? ?ED Results / Procedures / Treatments   ?Labs ?(all labs ordered are listed, but only abnormal results are displayed) ?Labs Reviewed  ?LACTIC ACID, PLASMA - Abnormal; Notable for the following components:  ?    Result Value  ? Lactic Acid, Venous 7.6 (*)   ? All other components within normal limits  ?LACTIC ACID, PLASMA - Abnormal; Notable for the following components:  ? Lactic Acid, Venous 6.3 (*)   ? All other components within normal limits  ?COMPREHENSIVE METABOLIC PANEL - Abnormal; Notable for the following components:  ? Potassium 3.3 (*)   ? CO2 19 (*)   ? Glucose, Bld 165 (*)   ? BUN 29 (*)   ? Creatinine, Ser 4.44 (*)   ? Total Protein 6.3 (*)   ? Albumin 3.2 (*)   ? AST 899 (*)   ? ALT 478 (*)   ? GFR, Estimated 15 (*)   ? Anion gap 20 (*)   ? All other components within normal limits  ?CBC WITH DIFFERENTIAL/PLATELET - Abnormal; Notable for the following components:  ? WBC 12.5 (*)   ? nRBC 0.4 (*)   ? Neutro Abs 11.5 (*)   ? Monocytes Absolute 0.0 (*)   ? nRBC 2 (*)   ? All other components within normal limits  ?I-STAT VENOUS BLOOD GAS, ED - Abnormal; Notable for the following components:  ? pH, Ven 7.485 (*)   ? pCO2, Ven 27.4 (*)   ? pO2, Ven 49 (*)   ? TCO2 21 (*)   ? Potassium 3.2 (*)   ? Calcium, Ion 1.01 (*)   ? All other components within normal limits  ?RESP PANEL BY RT-PCR (FLU A&B, COVID) [id number]  ?CULTURE, BLOOD (ROUTINE X 2)  ?CULTURE, BLOOD (ROUTINE X 2)  ?URINE CULTURE   ?PROTIME-INR  ?APTT  ?LIPASE, BLOOD  ?TSH  ?AMMONIA  ?ETHANOL  ?URINALYSIS, ROUTINE W REFLEX MICROSCOPIC  ?RAPID URINE DRUG SCREEN, HOSP PERFORMED  ?HIV ANTIBODY (ROUTINE TESTING W REFLEX)  ?CBC  ?CREATININE, SERUM  ?SALICYLATE LEVEL  ?ACETAMINOPHEN LEVEL  ?LACTIC ACID, PLASMA  ?LACTIC ACID, PLASMA  ?COLD AGGLUTININ TITER  ? ? ?EKG ?EKG Interpretation ? ?Date/Time:  Tuesday December 06 2021 18:54:34 EDT ?Ventricular Rate:  133 ?PR Interval:    ?QRS Duration: 148 ?QT Interval:  379 ?QTC Calculation: 564 ?R Axis:   130 ?Text Interpretation: Junctional tachycardia RBBB and LPFB when compared to prior, similar? QRS appearance but faste rate. No STEMI Confirmed by Theda Belfast ([id number]) on 12/06/2021 7:35:57 PM ? ?Radiology ?CT HEAD WO CONTRAST ( ) ? ?Result Date: 12/06/2021 ?CLINICAL DATA:  Lethargy and diaphoresis. EXAM: CT HEAD WITHOUT CONTRAST TECHNIQUE: Contiguous axial images were obtained from the base of the skull through the vertex without intravenous contrast. RADIATION DOSE REDUCTION: This exam was performed according to the departmental dose-optimization program which includes automated exposure control, adjustment of the mA and/or kV according to patient size and/or use of iterative reconstruction technique. COMPARISON:  None. FINDINGS: Brain: No evidence of acute infarction, hemorrhage, hydrocephalus, extra-axial collection or mass lesion/mass effect. Vascular: No hyperdense vessel or unexpected calcification. Skull: Normal. Negative for fracture or focal lesion. Sinuses/Orbits: There is marked severity bilateral ethmoid sinus mucosal thickening. A moderate sized sphenoid sinus air-fluid level is noted. Other: None. IMPRESSION: 1. No acute intracranial abnormality. 2. Bilateral ethmoid sinus and sphenoid sinus disease. Electronically Signed   By: Aram Candela M.D.   On: 12/06/2021 20:46  ? ?DG Chest Port 1 View ? ?Result Date: 12/06/2021 ?CLINICAL DATA:  Possible sepsis EXAM: PORTABLE CHEST 1 VIEW  COMPARISON:  03/18/2021 FINDINGS: Cardiac shadow is enlarged but accentuated by the frontal technique. Lungs are well aerated bilaterally. No focal infiltrate or effusion is seen. No bony abnormality is noted. IMPR

## 2021-12-06 NOTE — Consult Note (Signed)
? ? ?Reason for Consult/Chief Complaint: SBO ?Consultant: Sherry Ruffing, MD ? ?Casey Garrett is an 56 y.o. male.  ? ?HPI: Casey Garrett presents with clinical suspicion of sepsis. Patient reports feeling unwell for the last two days. He reports one episode of vomiting "bile" two days ago, but no other episodes of vomiting. He denies passage of flatus prior to presentation but has had multiple loose BMs since being admitted, requiring a rectal tube. He has not had any abdominal surgeries in the past.  ? ?Past Medical History:  ?Diagnosis Date  ? Anxiety   ? Depression   ? Hypertension   ? Migraines   ? ? ?History reviewed. No pertinent surgical history. ? ?Family History  ?Problem Relation Age of Onset  ? Hypertension Mother   ? ? ?Social History:  reports that he has quit smoking. He has never used smokeless tobacco. He reports current alcohol use. He reports that he does not currently use drugs after having used the following drugs: Marijuana. ? ?Allergies: No Known Allergies ? ?Medications: I have reviewed the patient's current medications. ? ?Results for orders placed or performed during the hospital encounter of 12/06/21 (from the past 48 hour(s))  ?Resp Panel by RT-PCR (Flu A&B, Covid) Nasopharyngeal Swab     Status: None  ? Collection Time: 12/06/21  6:45 PM  ? Specimen: Nasopharyngeal Swab; Nasopharyngeal(NP) swabs in vial transport medium  ?Result Value Ref Range  ? SARS Coronavirus 2 by RT PCR NEGATIVE NEGATIVE  ?  Comment: (NOTE) ?SARS-CoV-2 target nucleic acids are NOT DETECTED. ? ?The SARS-CoV-2 RNA is generally detectable in upper respiratory ?specimens during the acute phase of infection. The lowest ?concentration of SARS-CoV-2 viral copies this assay can detect is ?138 copies/mL. A negative result does not preclude SARS-Cov-2 ?infection and should not be used as the sole basis for treatment or ?other patient management decisions. A negative result may occur with  ?improper specimen collection/handling,  submission of specimen other ?than nasopharyngeal swab, presence of viral mutation(s) within the ?areas targeted by this assay, and inadequate number of viral ?copies(<138 copies/mL). A negative result must be combined with ?clinical observations, patient history, and epidemiological ?information. The expected result is Negative. ? ?Fact Sheet for Patients:  ?EntrepreneurPulse.com.au ? ?Fact Sheet for Healthcare Providers:  ?IncredibleEmployment.be ? ?This test is no t yet approved or cleared by the [place] FDA and  ?has been authorized for detection and/or diagnosis of SARS-CoV-2 by ?FDA under an Emergency Use Authorization (EUA). This EUA will remain  ?in effect (meaning this test can be used) for the duration of the ?COVID-19 declaration under Section 564(b)(1) of the Act, 21 ?U.S.C.section 360bbb-3(b)(1), unless the authorization is terminated  ?or revoked sooner.  ? ? ?  ? Influenza A by PCR NEGATIVE NEGATIVE  ? Influenza B by PCR NEGATIVE NEGATIVE  ?  Comment: (NOTE) ?The Xpert Xpress SARS-CoV-2/FLU/RSV plus assay is intended as an aid ?in the diagnosis of influenza from Nasopharyngeal swab specimens and ?should not be used as a sole basis for treatment. Nasal washings and ?aspirates are unacceptable for Xpert Xpress SARS-CoV-2/FLU/RSV ?testing. ? ?Fact Sheet for Patients: ?EntrepreneurPulse.com.au ? ?Fact Sheet for Healthcare Providers: ?IncredibleEmployment.be ? ?This test is not yet approved or cleared by the [place] FDA and ?has been authorized for detection and/or diagnosis of SARS-CoV-2 by ?FDA under an Emergency Use Authorization (EUA). This EUA will remain ?in effect (meaning this test can be used) for the duration of the ?COVID-19 declaration under Section 564(b)(1) of the Act, 21  U.S.C. ?section 360bbb-3(b)(1), unless the authorization is terminated or ?revoked. ? ?Performed at [facility], [place] [address],  [place], [place] ?[address] ?  ?Lactic acid, plasma     Status: Abnormal  ? Collection Time: 12/06/21  6:45 PM  ?Result Value Ref Range  ? Lactic Acid, Venous 7.6 (HH) 0.5 - 1.9 mmol/L  ?  Comment: CRITICAL RESULT CALLED TO, READ BACK BY AND VERIFIED WITH: ?[place] 12/06/2021 WBOND ?Performed at [facility], [place] [address], [place], [place] [address] ?  ?Comprehensive metabolic panel     Status: Abnormal  ? Collection Time: 12/06/21  6:45 PM  ?Result Value Ref Range  ? Sodium 138 135 - 145 mmol/L  ? Potassium 3.3 (L) 3.5 - 5.1 mmol/L  ? Chloride 99 98 - 111 mmol/L  ? CO2 19 (L) 22 - 32 mmol/L  ? Glucose, Bld 165 (H) 70 - 99 mg/dL  ?  Comment: Glucose reference range applies only to samples taken after fasting for at least 8 hours.  ? BUN 29 (H) 6 - 20 mg/dL  ? Creatinine, Ser 4.44 (H) 0.61 - 1.24 mg/dL  ? Calcium 8.9 8.9 - 10.3 mg/dL  ? Total Protein 6.3 (L) 6.5 - 8.1 g/dL  ? Albumin 3.2 (L) 3.5 - 5.0 g/dL  ? AST 899 (H) 15 - 41 U/L  ? ALT 478 (H) 0 - 44 U/L  ? Alkaline Phosphatase 122 38 - 126 U/L  ? Total Bilirubin 1.2 0.3 - 1.2 mg/dL  ? GFR, Estimated 15 (L) >60 mL/min  ?  Comment: (NOTE) ?Calculated using the CKD-EPI Creatinine Equation (2021) ?  ? Anion gap 20 (H) 5 - 15  ?  Comment: Performed at [facility], [place] [address] [place], [place] [address]  ?CBC WITH DIFFERENTIAL     Status: Abnormal  ? Collection Time: 12/06/21  6:45 PM  ?Result Value Ref Range  ? WBC 12.5 (H) 4.0 - 10.5 K/uL  ? RBC 5.64 4.22 - 5.81 MIL/uL  ? Hemoglobin 16.6 13.0 - 17.0 g/dL  ? HCT 49.4 39.0 - 52.0 %  ? MCV 87.6 80.0 - 100.0 fL  ? MCH 29.4 26.0 - 34.0 pg  ? MCHC 33.6 30.0 - 36.0 g/dL  ? RDW 13.2 11.5 - 15.5 %  ? Platelets 165 150 - 400 K/uL  ? nRBC 0.4 (H) 0.0 - 0.2 %  ? Neutrophils Relative % 92 %  ? Neutro Abs 11.5 (H) 1.7 - 7.7 K/uL  ? Lymphocytes Relative 7 %  ? Lymphs Abs 0.9 0.7 - 4.0 K/uL  ? Monocytes Relative 0 %  ? Monocytes Absolute 0.0 (L) 0.1 - 1.0 K/uL  ? Eosinophils Relative 1 %  ? Eosinophils  Absolute 0.1 0.0 - 0.5 K/uL  ? Basophils Relative 0 %  ? Basophils Absolute 0.0 0.0 - 0.1 K/uL  ? WBC Morphology See Note   ?  Comment: Increased Bands. >20% Bands  ? nRBC 2 (H) 0 /100 WBC  ? Abs Immature Granulocytes 0.00 0.00 - 0.07 K/uL  ?  Comment: Performed at [facility], [place] [address], [place], [place] [address]  ?Protime-INR     Status: None  ? Collection Time: 12/06/21  6:45 PM  ?Result Value Ref Range  ? Prothrombin Time 15.2 11.4 - 15.2 seconds  ? INR 1.2 0.8 - 1.2  ?  Comment: (NOTE) ?INR goal varies based on device and disease states. ?Performed at [facility], [address] [place], [place] ?[address] ?  ?APTT     Status: None  ? Collection Time: 12/06/21  6:45 PM  ?Result Value Ref Range  ? aPTT 27 24 - 36 seconds  ?  Comment: Performed at [facility], [place] [address], [place], [place] [address]  ?Lipase, blood     Status: None  ? Collection Time: 12/06/21  6:45 PM  ?Result Value Ref Range  ? Lipase 25 11 - 51 U/L  ?  Comment: Performed at [facility], [place] [address] [place], [place] [address]  ?TSH     Status: None  ? Collection Time: 12/06/21  6:46 PM  ?Result Value Ref Range  ? TSH 3.800 0.350 - 4.500 uIU/mL  ?  Comment: Performed by a 3rd Generation assay with a functional sensitivity of <=0.01 uIU/mL. ?Performed at [facility], [place] [address] [place], [place] [address] ?  ?Ammonia     Status: None  ? Collection Time: 12/06/21  6:46 PM  ?Result Value Ref Range  ? Ammonia 29 9 - 35 umol/L  ?  Comment: Performed at [facility], [place] [address], [place], [place] [address]  ?Ethanol     Status: None  ? Collection Time: 12/06/21  6:47 PM  ?Result Value Ref Range  ? Alcohol, Ethyl (B) <10 <10 mg/dL  ?  Comment: (NOTE) ?Lowest detectable limit for serum alcohol is 10 mg/dL. ? ?For medical purposes only. ?Performed at [facility], [place] [address] [place], [place] ?[address] ?  ?I-Stat venous blood gas, ED     Status: Abnormal  ? Collection Time: 12/06/21   6:57 PM  ?Result Value Ref Range  ? pH, Ven 7.485 (H) 7.25 - 7.43  ? pCO2, Ven 27.4 (L) 44 - 60 mmHg  ? pO2, Ven 49 (H) 32 - 45 mmHg  ? Bicarbonate 20.7 20.0 - 28.0 mmol/L  ? TCO2 21 (L) 22 - 32 mmol/L  ? O2 [place]

## 2021-12-06 NOTE — ED Notes (Signed)
Patient transported to CT 

## 2021-12-06 NOTE — Progress Notes (Addendum)
Pharmacy Antibiotic Note ? ?Casey Garrett is a 56 y.o. male admitted on 12/06/2021 with sepsis.  Pharmacy has been consulted for cefepime and vancomycin dosing. ? ?Scr elevated at 4.44 - baseline per records appears to be ~1. WBC 12.5, temperature 103.17F on presentation. Weight from previous visit used (102.7 kg on 08/31/2021). No scheduled vancomycin due to AKI.  ? ?Plan: ?Cefepime 2g IV q24h ?Vancomycin 2000 mg IV x1  ?F/u renal function for subsequent doses ?F/u clinical course, fever curve, cultures ?  ? ?Temp (24hrs), Avg:103.7 ?F (39.8 ?C), Min:103.7 ?F (39.8 ?C), Max:103.7 ?F (39.8 ?C) ? ?No results for input(s): WBC, CREATININE, LATICACIDVEN, VANCOTROUGH, VANCOPEAK, VANCORANDOM, GENTTROUGH, GENTPEAK, GENTRANDOM, TOBRATROUGH, TOBRAPEAK, TOBRARND, AMIKACINPEAK, AMIKACINTROU, AMIKACIN in the last 168 hours.  ?CrCl cannot be calculated (Patient's most recent lab result is older than the maximum 21 days allowed.).   ? ?No Known Allergies ? ?Antimicrobials this admission: ?Cefepime 4/11 >>  ?Metronidazole 4/11 >>  ?Vancomycin 4/11 >> ? ?Dose adjustments this admission: ?N/A ? ?Microbiology results: ?4/11 BCx: in process ? ?Thank you for allowing pharmacy to be a part of this patient?s care. ? ?Ulis Rias ?12/06/2021 6:49 PM ? ?

## 2021-12-06 NOTE — H&P (Signed)
? ?NAME:  Casey Garrett, MRN:  [id number], DOB:  Sep 28, 1965, LOS: 0 ?ADMISSION DATE:  12/06/2021, CONSULTATION DATE: December 06, 2021 ?REFERRING MD: ED physician, CHIEF COMPLAINT: Nausea vomiting fever ? ?History of Present Illness:  ?Patient not able to give any history son has left call the number listed son was not there from the medical record he started vomiting yesterday today he has been really febrile or altered came into the ED he had blood pressure that was low at some point got 4-1/2 L of fluid continue to be very febrile scan head negative scan abdomen intestinal obstruction surgery consulted going to see the patient patient states the bleeding otherwise to her questions but not able to answer yes or no.  CT scan with small bowel obstruction with transition point might have a small internal hernia. ?Objective   ?Blood pressure 114/69, pulse (!) 132, temperature (!) 103.1 ?F (39.5 ?C), temperature source Oral, resp. rate (!) 40, SpO2 99 %. ?   ?   ? ?Intake/Output Summary (Last 24 hours) at 12/06/2021 2224 ?Last data filed at 12/06/2021 2220 ?Gross per 24 hour  ?Intake 2794.06 ml  ?Output --  ?Net 2794.06 ml  ? ?There were no vitals filed for this visit. ? ?Examination: ?General: Severe distress very febrile ?Neuro: No nuchal rigidity able to answer open his eyes but not able to answer questions moving upper lower extremities without limitations  ?HEENT:  atraumatic , no jaundice , dry mucous membranes  ?Cardiovascular: Tachycardia with no murmurs ?Lungs:  CTA bilateral , no wheezing or crackles  ?Abdomen:  Soft lax +BS , no tenderness . ?Musculoskeletal:  WNL , normal pulses  ?Skin:  No rash   ? ?Assessment & Plan:  ? ? ?--Severe sepsis aggressive IV fluid resuscitation we will not be able to give the patient any IV nonsteroidals or Tylenol given acute kidney injury and high liver enzymes broad-spectrum antibiotics surgery is following might need to go to the OR if no clinical improvement, strangulation at  some point will need to be ruled out from small internal hernia if no clinical improvement ? ? ?-- Severe metabolic derangements will check salicylate level ? ? ?--We will add cooling blanket ? ?-- Acute kidney injury from dehydration low blood pressure prerenal vomiting we will aggressively hydrate him with cold saline ? ? ?--Patient will come to the ICU for critical condition chance of compromise deterioration critical care time spent the care of this patient is 58 minutes. ? ?Labs   ?CBC: ?Recent Labs  ?Lab 12/06/21 ?1845 12/06/21 ?1857  ?WBC 12.5*  --   ?NEUTROABS 11.5*  --   ?HGB 16.6 16.7  ?HCT 49.4 49.0  ?MCV 87.6  --   ?PLT 165  --   ? ? ?Basic Metabolic Panel: ?Recent Labs  ?Lab 12/06/21 ?1845 12/06/21 ?1857  ?NA 138 137  ?K 3.3* 3.2*  ?CL 99  --   ?CO2 19*  --   ?GLUCOSE 165*  --   ?BUN 29*  --   ?CREATININE 4.44*  --   ?CALCIUM 8.9  --   ? ?GFR: ?CrCl cannot be calculated (Unknown ideal weight.). ?Recent Labs  ?Lab 12/06/21 ?1845 12/06/21 ?2106  ?WBC 12.5*  --   ?LATICACIDVEN 7.6* 6.3*  ? ? ?Liver Function Tests: ?Recent Labs  ?Lab 12/06/21 ?1845  ?AST 899*  ?ALT 478*  ?ALKPHOS 122  ?BILITOT 1.2  ?PROT 6.3*  ?ALBUMIN 3.2*  ? ?Recent Labs  ?Lab 12/06/21 ?1845  ?LIPASE 25  ? ?Recent  Labs  ?Lab 12/06/21 ?1846  ?AMMONIA 29  ? ? ?ABG ?   ?Component Value Date/Time  ? HCO3 20.7 12/06/2021 1857  ? TCO2 21 (L) 12/06/2021 1857  ? ACIDBASEDEF 1.0 12/06/2021 1857  ? O2SAT 88 12/06/2021 1857  ?  ? ?Coagulation Profile: ?Recent Labs  ?Lab 12/06/21 ?1845  ?INR 1.2  ? ? ?Cardiac Enzymes: ?No results for input(s): CKTOTAL, CKMB, CKMBINDEX, TROPONINI in the last 168 hours. ? ?HbA1C: ?No results found for: HGBA1C ? ?CBG: ?No results for input(s): GLUCAP in the last 168 hours. ? ?Review of Systems:   ?Not able to answer any questions not able to obtain due to patient condition ? ?Past Medical History:  ?He,  has a past medical history of Anxiety, Depression, Hypertension, and Migraines.  ? ?Surgical History:  ?History  reviewed. No pertinent surgical history.  ? ?Social History:  ? reports that he has quit smoking. He has never used smokeless tobacco. He reports current alcohol use. He reports that he does not currently use drugs after having used the following drugs: Marijuana.  ? ?Family History:  ?His family history includes Hypertension in his mother.  ? ?Allergies ?No Known Allergies  ? ?Home Medications  ?Prior to Admission medications   ?Medication Sig Start Date End Date Taking? Authorizing Provider  ?albuterol (VENTOLIN HFA) 108 (90 Base) MCG/ACT inhaler Inhale 2 puffs into the lungs every 6 (six) hours as needed for wheezing or shortness of breath.    [provider]  ?amLODipine (NORVASC) 10 MG tablet Take 1 tablet (10 mg total) by mouth daily for 14 days. 08/31/21 09/14/21  Jeannie Fend, PA-C  ?Aspirin-Acetaminophen-Caffeine (GOODYS EXTRA STRENGTH) 500-325-65 MG PACK Take 1 packet by mouth daily as needed (headache).    [provider]  ?benzonatate (TESSALON) 100 MG capsule Take 1 capsule (100 mg total) by mouth every 8 (eight) hours. ?Patient not taking: No sig reported 01/29/21   Caccavale, Sophia, PA-C  ?doxycycline (VIBRAMYCIN) 100 MG capsule Take 1 capsule (100 mg total) by mouth 2 (two) times daily. ?Patient not taking: No sig reported 01/20/21   Pollyann Savoy, MD  ?fluticasone [facility]) 50 MCG/ACT nasal spray Place 1 spray into both nostrils daily. ?Patient not taking: No sig reported 07/13/18   Raeford Razor, MD  ?hydrochlorothiazide (HYDRODIURIL) 25 MG tablet Take 1 tablet (25 mg total) by mouth daily for 14 days. 08/31/21 09/14/21  Jeannie Fend, PA-C  ?metFORMIN (GLUCOPHAGE-XR) 500 MG 24 hr tablet Take 1 tablet (500 mg total) by mouth daily with breakfast for 14 days. 08/31/21 09/14/21  Jeannie Fend, PA-C  ?losartan (COZAAR) 100 MG tablet Take 1 tablet (100 mg total) by mouth daily. 10/08/18 01/20/21  Nira Conn, MD  ?  ? ?Critical care time: Critical care time spent in the  care of this patient is 58 minutes ?  ? ? ? ?  ?

## 2021-12-06 NOTE — Sepsis Progress Note (Signed)
Sepsis protocol monitored by eLink 

## 2021-12-06 NOTE — ED Notes (Signed)
Casey Garrett, son [phone] ?

## 2021-12-06 NOTE — ED Triage Notes (Signed)
Pt BIB [facility] from home, [facility] at 7:30am today, when his roommate left for work. Per roommate, pt told her he was "sick" last night with nausea/vomiting. Pt found on the couch at 5:40pm when she got home, unresponsive. On EMS arrival, pt lethargic and diaphoretic. Pt alert to voice and following simple commands. EMS CBG 170. ?

## 2021-12-07 ENCOUNTER — Inpatient Hospital Stay (HOSPITAL_COMMUNITY): Payer: Self-pay

## 2021-12-07 DIAGNOSIS — F1910 Other psychoactive substance abuse, uncomplicated: Secondary | ICD-10-CM

## 2021-12-07 DIAGNOSIS — N171 Acute kidney failure with acute cortical necrosis: Secondary | ICD-10-CM

## 2021-12-07 LAB — GLUCOSE, CAPILLARY: Glucose-Capillary: 106 mg/dL — ABNORMAL HIGH (ref 70–99)

## 2021-12-07 LAB — BLOOD CULTURE ID PANEL (REFLEXED) - BCID2

## 2021-12-07 LAB — URINALYSIS, ROUTINE W REFLEX MICROSCOPIC
Bilirubin Urine: NEGATIVE
Glucose, UA: NEGATIVE mg/dL
Ketones, ur: NEGATIVE mg/dL
Nitrite: NEGATIVE
Protein, ur: 100 mg/dL — AB
Specific Gravity, Urine: 1.015 (ref 1.005–1.030)
WBC, UA: >50 WBC/hpf — ABNORMAL HIGH (ref 0–5)
pH: 5 (ref 5.0–8.0)

## 2021-12-07 LAB — HEMOGLOBIN A1C
Hgb A1c MFr Bld: 7.8 % — ABNORMAL HIGH (ref 4.8–5.6)
Mean Plasma Glucose: 177.16 mg/dL

## 2021-12-07 LAB — CBC
HCT: 44.9 % (ref 39.0–52.0)
Hemoglobin: 15.4 g/dL (ref 13.0–17.0)
MCH: 29.3 pg (ref 26.0–34.0)
MCHC: 34.3 g/dL (ref 30.0–36.0)
MCV: 85.4 fL (ref 80.0–100.0)
Platelets: 95 10*3/uL — ABNORMAL LOW (ref 150–400)
RBC: 5.26 MIL/uL (ref 4.22–5.81)
RDW: 13.4 % (ref 11.5–15.5)
WBC: 22.4 10*3/uL — ABNORMAL HIGH (ref 4.0–10.5)
nRBC: 0 % (ref 0.0–0.2)

## 2021-12-07 LAB — RAPID URINE DRUG SCREEN, HOSP PERFORMED
Amphetamines: POSITIVE — AB
Barbiturates: NOT DETECTED
Benzodiazepines: NOT DETECTED
Cocaine: NOT DETECTED
Opiates: NOT DETECTED
Tetrahydrocannabinol: POSITIVE — AB

## 2021-12-07 LAB — COMPREHENSIVE METABOLIC PANEL
ALT: 346 U/L — ABNORMAL HIGH (ref 0–44)
AST: 305 U/L — ABNORMAL HIGH (ref 15–41)
Albumin: 2.8 g/dL — ABNORMAL LOW (ref 3.5–5.0)
Alkaline Phosphatase: 104 U/L (ref 38–126)
Anion gap: 18 — ABNORMAL HIGH (ref 5–15)
BUN: 41 mg/dL — ABNORMAL HIGH (ref 6–20)
CO2: 20 mmol/L — ABNORMAL LOW (ref 22–32)
Calcium: 8.3 mg/dL — ABNORMAL LOW (ref 8.9–10.3)
Chloride: 102 mmol/L (ref 98–111)
Creatinine, Ser: 4.97 mg/dL — ABNORMAL HIGH (ref 0.61–1.24)
GFR, Estimated: 13 mL/min — ABNORMAL LOW (ref >60)
Glucose, Bld: 157 mg/dL — ABNORMAL HIGH (ref 70–99)
Potassium: 3.8 mmol/L (ref 3.5–5.1)
Sodium: 140 mmol/L (ref 135–145)
Total Bilirubin: 1.6 mg/dL — ABNORMAL HIGH (ref 0.3–1.2)
Total Protein: 5.7 g/dL — ABNORMAL LOW (ref 6.5–8.1)

## 2021-12-07 LAB — LACTIC ACID, PLASMA: Lactic Acid, Venous: 4.1 mmol/L (ref 0.5–1.9)

## 2021-12-07 LAB — CREATININE, URINE, RANDOM: Creatinine, Urine: 96.39 mg/dL

## 2021-12-07 LAB — HIV ANTIBODY (ROUTINE TESTING W REFLEX): HIV Screen 4th Generation wRfx: NONREACTIVE

## 2021-12-07 LAB — SODIUM, URINE, RANDOM: Sodium, Ur: 67 mmol/L

## 2021-12-07 LAB — MRSA NEXT GEN BY PCR, NASAL: MRSA by PCR Next Gen: NOT DETECTED

## 2021-12-07 MED ORDER — SODIUM CHLORIDE 0.9 % IV SOLN
2.0000 g | INTRAVENOUS | Status: DC
  Administered 2021-12-07 – 2021-12-08 (×2): 2 g via INTRAVENOUS
  Filled 2021-12-07 (×3): qty 20

## 2021-12-07 MED ORDER — LACTATED RINGERS IV BOLUS
1000.0000 mL | Freq: Once | INTRAVENOUS | Status: AC
  Administered 2021-12-07: 1000 mL via INTRAVENOUS

## 2021-12-07 MED ORDER — OXYCODONE HCL 5 MG PO TABS
5.0000 mg | ORAL_TABLET | Freq: Once | ORAL | Status: AC
  Administered 2021-12-07: 5 mg via ORAL
  Filled 2021-12-07: qty 1

## 2021-12-07 MED ORDER — HYDROMORPHONE HCL 1 MG/ML IJ SOLN
0.5000 mg | Freq: Once | INTRAMUSCULAR | Status: AC
  Administered 2021-12-07: 0.5 mg via INTRAVENOUS
  Filled 2021-12-07: qty 1

## 2021-12-07 MED ORDER — HYDROMORPHONE HCL 1 MG/ML IJ SOLN: 0.5000 mg | Freq: Once | INTRAMUSCULAR | Status: DC

## 2021-12-07 NOTE — Progress Notes (Signed)
PHARMACY - PHYSICIAN COMMUNICATION ?CRITICAL VALUE ALERT - BLOOD CULTURE IDENTIFICATION (BCID) ? ?Casey Garrett is an 56 y.o. male who presented to [facility] on 12/06/2021 with a chief complaint of altered mental status ? ?Assessment:  WBC 13.2, last temp 99 ? ?Name of physician (or Provider) Contacted: Dr. Arsenio Loader ? ?Current antibiotics: Vancomycin, Cefepime ? ?Changes to prescribed antibiotics recommended:  ?No changes ?Can likely DC vancomycin later today ? ?Results for orders placed or performed during the hospital encounter of 12/06/21  ?Blood Culture ID Panel (Reflexed) (Collected: 12/06/2021  6:45 PM)  ?Result Value Ref Range  ? Enterococcus faecalis NOT DETECTED NOT DETECTED  ? Enterococcus Faecium NOT DETECTED NOT DETECTED  ? Listeria monocytogenes NOT DETECTED NOT DETECTED  ? Staphylococcus species NOT DETECTED NOT DETECTED  ? Staphylococcus aureus (BCID) NOT DETECTED NOT DETECTED  ? Staphylococcus epidermidis NOT DETECTED NOT DETECTED  ? Staphylococcus lugdunensis NOT DETECTED NOT DETECTED  ? Streptococcus species NOT DETECTED NOT DETECTED  ? Streptococcus agalactiae NOT DETECTED NOT DETECTED  ? Streptococcus pneumoniae NOT DETECTED NOT DETECTED  ? Streptococcus pyogenes NOT DETECTED NOT DETECTED  ? A.calcoaceticus-baumannii NOT DETECTED NOT DETECTED  ? Bacteroides fragilis NOT DETECTED NOT DETECTED  ? Enterobacterales DETECTED (A) NOT DETECTED  ? Enterobacter cloacae complex NOT DETECTED NOT DETECTED  ? Escherichia coli NOT DETECTED NOT DETECTED  ? Klebsiella aerogenes NOT DETECTED NOT DETECTED  ? Klebsiella oxytoca NOT DETECTED NOT DETECTED  ? Klebsiella pneumoniae DETECTED (A) NOT DETECTED  ? Proteus species NOT DETECTED NOT DETECTED  ? Salmonella species NOT DETECTED NOT DETECTED  ? Serratia marcescens NOT DETECTED NOT DETECTED  ? Haemophilus influenzae NOT DETECTED NOT DETECTED  ? Neisseria meningitidis NOT DETECTED NOT DETECTED  ? Pseudomonas aeruginosa NOT DETECTED NOT DETECTED  ?  Stenotrophomonas maltophilia NOT DETECTED NOT DETECTED  ? Candida albicans NOT DETECTED NOT DETECTED  ? Candida auris NOT DETECTED NOT DETECTED  ? Candida glabrata NOT DETECTED NOT DETECTED  ? Candida krusei NOT DETECTED NOT DETECTED  ? Candida parapsilosis NOT DETECTED NOT DETECTED  ? Candida tropicalis NOT DETECTED NOT DETECTED  ? Cryptococcus neoformans/gattii NOT DETECTED NOT DETECTED  ? CTX-M ESBL NOT DETECTED NOT DETECTED  ? Carbapenem resistance IMP NOT DETECTED NOT DETECTED  ? Carbapenem resistance KPC NOT DETECTED NOT DETECTED  ? Carbapenem resistance NDM NOT DETECTED NOT DETECTED  ? Carbapenem resist OXA 48 LIKE NOT DETECTED NOT DETECTED  ? Carbapenem resistance VIM NOT DETECTED NOT DETECTED  ? ? ?Casey Garrett ?12/07/2021  6:53 AM ? ?

## 2021-12-07 NOTE — Progress Notes (Addendum)
? ?NAME:  Casey Garrett, MRN:  [id number], DOB:  [birth year], LOS: 1 ?ADMISSION DATE:  12/06/2021, CONSULTATION DATE:  12/07/2021 ?REFERRING MD:  EDP, CHIEF COMPLAINT:  sepsis ? ?History of Present Illness:  ?Patient not able to give any history son has left call the number listed son was not there from the medical record he started vomiting yesterday today he has been really febrile or altered came into the ED he had blood pressure that was low at some point got 4-1/2 L of fluid continue to be very febrile scan head negative scan abdomen intestinal obstruction surgery consulted going to see the patient patient states the bleeding otherwise to her questions but not able to answer yes or no.  CT scan with small bowel obstruction with transition point might have a small internal hernia. ? ?Pertinent  Medical History  ?Type 2 DM, HTN ? ?Significant Hospital Events: ?Including procedures, antibiotic start and stop dates in addition to other pertinent events   ? ? ?Interim History / Subjective:  ?Overnight multiple bowel movements. No longer nauseated and no abdominal pain. He is not hungry.  ? ?Objective   ?Blood pressure 138/85, pulse (!) 109, temperature 98.5 ?F (36.9 ?C), temperature source Oral, resp. rate 20, SpO2 94 %. ?   ?   ? ?Intake/Output Summary (Last 24 hours) at 12/07/2021 0954 ?Last data filed at 12/07/2021 0400 ?Gross per 24 hour  ?Intake 3415.97 ml  ?Output 900 ml  ?Net 2515.97 ml  ? ?There were no vitals filed for this visit. ? ?Examination: ?Gen:      Laying down, resting, no distress ?HEENT:  mmm ?Lungs:   ctab no wheezes or crackles ?CV:         tachycardic, regular ?Abd:     Hypoactive bowel sounds,  ?Ext:    No edema ?Skin:      Warm and dry; no rashes ?Neuro:  awake alert, follows commands, no distress. oriented x4 ? ? ?UA dirty ?K 3.2 ?Cr 4.92 ?AST 900 ?CO2 19 ?Lactic acid 4.1 ?ALT 478 ? ?Resolved Hospital Problem list   ? ? ?Assessment & Plan:  ? ?DYLEN MCELHANNON is a 56 y.o. man who presents  with: ? ?Resolving SBO ?- came in with clinical and radiographic SBO, now abdominal exam is improving and he is having Bms ?- continue MIV, keep NPO ?- NG tube was never placed for decompression, will hold off for now as he seems to be improving spontaneously ? ?Lactic Acidosis secondary to above ?- IVF, downtrending ? ?AKI ?- likely pre-renal azoetemia given clinical history ?- monitor uop, IVF as above ? ?Transaminitis ?- suspect shock liver secondary to infection ?- could also be related to alcoholic use.  ? ?Severe sepsis secondary to Klebsiella bactermeia ?- suspect abdominal source or UTI  ?- narrowed to ceftriaxone today ? ?Polysubstance abuse ?- UDS positive for THC, amphetamines ?- monitor for etoh withdrawal syndrome ? ?Hypokalemia ?- repeating BMET, replacing cautiously due to renal failure ? ?Can consider transfer out of ICU later today.  ? ?Best Practice (right click and "Reselect all SmartList Selections" daily)  ? ?Diet/type: NPO ?DVT prophylaxis: prophylactic heparin  ?GI prophylaxis: N/A ?Lines: N/A ?Foley:  N/A ?Code Status:  full code ?Last date of multidisciplinary goals of care discussion []  ? ?I spent 50 minutes in total visit time for this patient, with more than 50% spent counseling/coordinating care. ? ? ? , MD ?Pulmonary and Critical Care Medicine ?[facility] ?12/07/2021 10:05 AM ?Pager: see AMION ? ?  If no response to pager, please call critical care on call (see AMION) until 7pm ?After 7:00 pm call Elink   ? ? ?Labs   ?CBC: ?Recent Labs  ?Lab 12/06/21 ?1845 12/06/21 ?1857 12/06/21 ?2231  ?WBC 12.5*  --  13.2*  ?NEUTROABS 11.5*  --   --   ?HGB 16.6 16.7 15.7  ?HCT 49.4 49.0 45.9  ?MCV 87.6  --  87.4  ?PLT 165  --  112*  ? ? ?Basic Metabolic Panel: ?Recent Labs  ?Lab 12/06/21 ?1845 12/06/21 ?1857 12/06/21 ?2231  ?NA 138 137  --   ?K 3.3* 3.2*  --   ?CL 99  --   --   ?CO2 19*  --   --   ?GLUCOSE 165*  --   --   ?BUN 29*  --   --   ?CREATININE 4.44*  --  4.92*  ?CALCIUM 8.9   --   --   ? ?GFR: ?CrCl cannot be calculated (Unknown ideal weight.). ?Recent Labs  ?Lab 12/06/21 ?1845 12/06/21 ?2106 12/06/21 ?2231 12/07/21 ?0108  ?WBC 12.5*  --  13.2*  --   ?LATICACIDVEN 7.6* 6.3* 4.7* 4.1*  ? ? ?Liver Function Tests: ?Recent Labs  ?Lab 12/06/21 ?1845  ?AST 899*  ?ALT 478*  ?ALKPHOS 122  ?BILITOT 1.2  ?PROT 6.3*  ?ALBUMIN 3.2*  ? ?Recent Labs  ?Lab 12/06/21 ?1845  ?LIPASE 25  ? ?Recent Labs  ?Lab 12/06/21 ?1846  ?AMMONIA 29  ? ? ?ABG ?   ?Component Value Date/Time  ? HCO3 20.7 12/06/2021 1857  ? TCO2 21 (L) 12/06/2021 1857  ? ACIDBASEDEF 1.0 12/06/2021 1857  ? O2SAT 88 12/06/2021 1857  ?  ? ?Coagulation Profile: ?Recent Labs  ?Lab 12/06/21 ?1845  ?INR 1.2  ? ? ?Cardiac Enzymes: ?No results for input(s): CKTOTAL, CKMB, CKMBINDEX, TROPONINI in the last 168 hours. ? ?HbA1C: ?No results found for: HGBA1C ? ?CBG: ?No results for input(s): GLUCAP in the last 168 hours. ? ? ? ? ? ? ?

## 2021-12-07 NOTE — Consult Note (Addendum)
Nephrology Consult  ? ?Requesting provider: Dr. Celine Mans ?Service requesting consult: PCCM ?Reason for consult: AKI ? ? ?Assessment/Recommendations: Casey Garrett is a/an 56 y.o. male with a past medical history of anxiety, depression, migraines, and HTN who presents with altered mental status, abdominal pain, cough, fever. ? ?AKI likely 2/2 ATN ?Normal kidney function at baseline with no history of CKD. On admission, Cr 4.44 and has trended up to 4.97 since yesterday. His kidney function did not respond to aggressive fluid rehydration in the setting of severe sepsis. CT abd with no signs of hydronephrosis or other structural abnormalities. His presentation appears consistent with ATN likely from his severe sepsis and long-term extra-strength goody powder use. ?-ordered additional 1L of LR on top of ongoing maintenance IVF ?-f/u urine sodium and urine creatinine ?-monitor daily Cr, dose meds for GFR ?-strict I/O's ?-maintain MAP >65 for optimal renal perfusion ?-avoid nephrotoxic medications including NSAIDs ?-use synthetic opioids (fentanyl/dilaudid) if needed ?-currently no indication for HD ?-will need to avoid Goody powder use in the future ? ?Severe sepsis 2/2 Klebsiella bacteremia ?-likely secondary to UTI, urine culture pending ?-getting IVF ?-currently on rocephin ?-management per primary team ? ?Resolving SBO ?-management per primary team and surgery ? ?Lactic acidosis - secondary to severe sepsis ?-improving, getting IVF ?-management per primary team ? ?Elevated liver enzymes - secondary to severe sepsis ?-improving ?-management per primary team ? ?T2DM ?-on metformin at home ?-does not have PCP ? ?Need for PCP ?-placed TOC consult for PCP and substance use resources given UDS results ? ? ?Recommendations conveyed to primary service.  ? ? ?Merrilyn Puma, MD ?[facility] PGY-2 ?12/07/2021 ?2:37 PM ? ? ?_____________________________________________________________________________________ ?CC: altered mental  status ? ?History of Present Illness: Casey Garrett is a/an 56 y.o. male with a past medical history of anxiety, depression, migraines, and HTN who presents with altered mental status, abdominal pain, cough, fever. ? ?Evaluated patient at bedside. He states that he began experiencing nausea with dry heaving a day or two ago after coming home from work. Began feeling very weak to the point where he could not move around much. During this time, he also notes feeling very hot and simultaneously feeling very cold to the point that he is shivering. 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 Also remembers having an insect bite on his right arm. Does not know if either of these could be contributing to his current symptoms. ? ?States that yesterday, his friend noticed that he was behaving strangely and was not talking to her. She called EMS, who brought patient to the ED. On arrival, he was noted to be in severe sepsis and found to have associated elevated liver enzymes, lactic acidosis, AKI, and an SBO. Nephrology was consulted for further evaluation of his AKI. ? ?Patient has never been told that he has a history of chronic kidney disease. He does state that he was diagnosed with diabetes at some point and is on metformin for this. He does not have a PCP who he follows and thus does not get regular monitoring of his chronic medical conditions. States that he obtains his metformin in the ED. He also notes that he does have high blood pressure, but he has never taken any medications for it. ? ?Of note, patient does endorse daily use of extra-strength Goody powder for over 5 years. States that he typically takes it about once daily for headaches. Denies any  other NSAID use.  ? ? ?Medications:  ?Current Facility-Administered Medications  ?Medication Dose Route Frequency Provider Last Rate Last Admin  ? cefTRIAXone (ROCEPHIN) 2 g in sodium chloride 0.9 % 100 mL  IVPB  2 g Intravenous Q24H Charlott Holleresai, Nikita S, MD      ? Chlorhexidine Gluconate Cloth 2 % PADS 6 each  6 each Topical Daily Ileana RoupIsmail, Ahmad M, MD   6 each at 12/07/21 [phone]  ? docusate sodium (COLACE) capsule 100 mg  100 mg Oral BID PRN Ileana RoupIsmail, Ahmad M, MD      ? heparin injection 5,000 Units  5,000 Units Subcutaneous Q8H Ileana RoupIsmail, Ahmad M, MD   5,000 Units at 12/06/21 2211  ? lactated ringers infusion   Intravenous Continuous Ileana RoupIsmail, Ahmad M, MD 125 mL/hr at 12/07/21 1200 Infusion Verify at 12/07/21 1200  ? polyethylene glycol (MIRALAX / GLYCOLAX) packet 17 g  17 g Oral Daily PRN Ileana RoupIsmail, Ahmad M, MD      ?  ? ?ALLERGIES ?Patient has no known allergies. ? ?MEDICAL HISTORY ?Past Medical History:  ?Diagnosis Date  ? Anxiety   ? Depression   ? Hypertension   ? Migraines   ?  ? ?SOCIAL HISTORY ?Social History  ? ?Socioeconomic History  ? Marital status: Single  ?  Spouse name: Not on file  ? Number of children: Not on file  ? Years of education: Not on file  ? Highest education level: Not on file  ?Occupational History  ? Not on file  ?Tobacco Use  ? Smoking status: Former  ? Smokeless tobacco: Never  ?Vaping Use  ? Vaping Use: Never used  ?Substance and Sexual Activity  ? Alcohol use: Yes  ?  Comment: "sometimes"  ? Drug use: Not Currently  ?  Types: Marijuana  ? Sexual activity: Not on file  ?Other Topics Concern  ? Not on file  ?Social History Narrative  ? Not on file  ? ?Social Determinants of Health  ? ?Financial Resource Strain: Not on file  ?Food Insecurity: Not on file  ?Transportation Needs: Not on file  ?Physical Activity: Not on file  ?Stress: Not on file  ?Social Connections: Not on file  ?Intimate Partner Violence: Not on file  ?  ? ?FAMILY HISTORY ?Family History  ?Problem Relation Age of Onset  ? Hypertension Mother   ?  ? ? ?Review of Systems: ?12 systems reviewed ?Otherwise as per HPI, all other systems reviewed and negative ? ?Physical Exam: ?Vitals:  ? 12/07/21 1000 12/07/21 1200  ?BP: (!) 157/107   ?Pulse:   (!) 114  ?Resp: (!) 25 16  ?Temp:  (!) 100.4 ?F (38 ?C)  ?SpO2: 95% 92%  ? ?Total I/O ?In: 949.8 [I.V.:949.8] ?Out: 800 [Urine:500; Stool:300] ? ?Intake/Output Summary (Last 24 hours) at 12/07/2021 1437 ?Last data filed at 12/07/2021 1200 ?Gross per 24 hour  ?Intake 4365.77 ml  ?Output 1700 ml  ?Net 2665.77 ml  ? ?General: middle aged male, laying in bed, no acute distress ?HEENT: anicteric sclera, oropharynx clear without lesions ?CV: tachycardic rate, normal rhythm, no murmurs, no gallops, no rubs, no peripheral edema ?Lungs: clear to auscultation bilaterally, no wheezing or crackles noted ?Abd: soft, mildly distended, mild TTP diffusely, hypoactive bowel sounds. ?Skin: warm and dry, no visible rashes ?Psych: alert, engaged, appropriate mood and affect ?Musculoskeletal: no obvious deformities ?Neuro: AAOx3, normal speech, no gross focal deficits  ? ?Test Results ?Reviewed ?Lab Results  ?Component Value Date  ? NA 140 12/07/2021  ? K 3.8  12/07/2021  ? CL 102 12/07/2021  ? CO2 20 (L) 12/07/2021  ? BUN 41 (H) 12/07/2021  ? CREATININE 4.97 (H) 12/07/2021  ? CALCIUM 8.3 (L) 12/07/2021  ? ALBUMIN 2.8 (L) 12/07/2021  ? ? ?CBC ?Recent Labs  ?Lab 12/06/21 ?1845 12/06/21 ?1857 12/06/21 ?2231 12/07/21 ?0942  ?WBC 12.5*  --  13.2* 22.4*  ?NEUTROABS 11.5*  --   --   --   ?HGB 16.6 16.7 15.7 15.4  ?HCT 49.4 49.0 45.9 44.9  ?MCV 87.6  --  87.4 85.4  ?PLT 165  --  112* 95*  ? ? ?I have reviewed all relevant outside healthcare records related to the patient's current hospitalization ? ?

## 2021-12-07 NOTE — Progress Notes (Signed)
Pt noted to have O2 sat 88% or lower while sleeping. Pt placed on 2L [place] at this time sat 90% and above. Pt educated on the reason for supp O2. ?

## 2021-12-07 NOTE — Progress Notes (Signed)
?  Transition of Care (TOC) Screening Note ? ? ?Patient Details  ?Name: Casey Garrett ?Date of Birth: 10/09/1965 ? ? ?Transition of Care (TOC) CM/SW Contact:    ?Mearl Latin, LCSW ?Phone Number: ?12/07/2021, 4:09 PM ? ? ? ?Transition of Care Department [facility]) has reviewed patient and no TOC needs have been identified at this time. We will continue to monitor patient advancement through interdisciplinary progression rounds. If new patient transition needs arise, please place a TOC consult. ? ? ?

## 2021-12-07 NOTE — Progress Notes (Signed)
eLink Physician-Brief Progress Note ?Patient Name: Casey Garrett ?DOB: 09/21/1965 ?MRN: [id number] ? ? ?Date of Service ? 12/07/2021  ?HPI/Events of Note ? Patient c/o headache. AST and ALT are both elevated and Creatinine = 4.97. Therefore, can not use Tylenol or NSAID.  ?eICU Interventions ? Plan: ?Oxycodone IR 5 mg PO X 1 now.   ? ? ? ?Intervention Category ?Major Interventions: Other: ? ?Kimra Kantor Dennard Nip ?12/07/2021, 8:35 PM ?

## 2021-12-07 NOTE — Plan of Care (Signed)
?  Problem: Safety: ?Goal: Non-violent Restraint(s) ?Outcome: Progressing ?  ?Problem: Education: ?Goal: Knowledge of General Education information will improve ?Description: Including pain rating scale, medication(s)/side effects and non-pharmacologic comfort measures ?Outcome: Progressing ?  ?Problem: Health Behavior/Discharge Planning: ?Goal: Ability to manage health-related needs will improve ?Outcome: Progressing ?  ?Problem: Clinical Measurements: ?Goal: Ability to maintain clinical measurements within normal limits will improve ?Outcome: Progressing ?  ?Problem: Nutrition: ?Goal: Adequate nutrition will be maintained ?Outcome: Progressing ?  ?Problem: Coping: ?Goal: Level of anxiety will decrease ?Outcome: Progressing ?  ?Problem: Elimination: ?Goal: Will not experience complications related to bowel motility ?Outcome: Progressing ?  ?Problem: Pain Managment: ?Goal: General experience of comfort will improve ?Outcome: Progressing ?  ?Problem: Safety: ?Goal: Ability to remain free from injury will improve ?Outcome: Progressing ?  ?Problem: Skin Integrity: ?Goal: Risk for impaired skin integrity will decrease ?Outcome: Progressing ?  ?

## 2021-12-08 DIAGNOSIS — E1169 Type 2 diabetes mellitus with other specified complication: Secondary | ICD-10-CM

## 2021-12-08 DIAGNOSIS — I10 Essential (primary) hypertension: Secondary | ICD-10-CM | POA: Diagnosis present

## 2021-12-08 DIAGNOSIS — E876 Hypokalemia: Secondary | ICD-10-CM | POA: Diagnosis present

## 2021-12-08 DIAGNOSIS — R7989 Other specified abnormal findings of blood chemistry: Secondary | ICD-10-CM

## 2021-12-08 DIAGNOSIS — B961 Klebsiella pneumoniae [K. pneumoniae] as the cause of diseases classified elsewhere: Secondary | ICD-10-CM

## 2021-12-08 DIAGNOSIS — R7881 Bacteremia: Secondary | ICD-10-CM

## 2021-12-08 DIAGNOSIS — E669 Obesity, unspecified: Secondary | ICD-10-CM

## 2021-12-08 DIAGNOSIS — E872 Acidosis, unspecified: Secondary | ICD-10-CM | POA: Diagnosis present

## 2021-12-08 DIAGNOSIS — N179 Acute kidney failure, unspecified: Secondary | ICD-10-CM | POA: Diagnosis present

## 2021-12-08 DIAGNOSIS — F1910 Other psychoactive substance abuse, uncomplicated: Secondary | ICD-10-CM | POA: Diagnosis present

## 2021-12-08 DIAGNOSIS — G9341 Metabolic encephalopathy: Secondary | ICD-10-CM | POA: Diagnosis present

## 2021-12-08 LAB — CBC WITH DIFFERENTIAL/PLATELET
Abs Immature Granulocytes: 4.26 10*3/uL — ABNORMAL HIGH (ref 0.00–0.07)
Basophils Absolute: 0.1 10*3/uL (ref 0.0–0.1)
Basophils Relative: 1 %
Eosinophils Absolute: 0 10*3/uL (ref 0.0–0.5)
Eosinophils Relative: 0 %
HCT: 41.8 % (ref 39.0–52.0)
Hemoglobin: 14.5 g/dL (ref 13.0–17.0)
Immature Granulocytes: 19 %
Lymphocytes Relative: 5 %
Lymphs Abs: 1.1 10*3/uL (ref 0.7–4.0)
MCH: 29.4 pg (ref 26.0–34.0)
MCHC: 34.7 g/dL (ref 30.0–36.0)
MCV: 84.8 fL (ref 80.0–100.0)
Monocytes Absolute: 1.2 10*3/uL — ABNORMAL HIGH (ref 0.1–1.0)
Monocytes Relative: 5 %
Neutro Abs: 15.6 10*3/uL — ABNORMAL HIGH (ref 1.7–7.7)
Neutrophils Relative %: 70 %
Platelets: 87 10*3/uL — ABNORMAL LOW (ref 150–400)
RBC: 4.93 MIL/uL (ref 4.22–5.81)
RDW: 13.3 % (ref 11.5–15.5)
WBC Morphology: INCREASED
WBC: 22.3 10*3/uL — ABNORMAL HIGH (ref 4.0–10.5)
nRBC: 0 % (ref 0.0–0.2)

## 2021-12-08 LAB — COMPREHENSIVE METABOLIC PANEL
ALT: 224 U/L — ABNORMAL HIGH (ref 0–44)
AST: 137 U/L — ABNORMAL HIGH (ref 15–41)
Albumin: 2.6 g/dL — ABNORMAL LOW (ref 3.5–5.0)
Alkaline Phosphatase: 113 U/L (ref 38–126)
Anion gap: 10 (ref 5–15)
BUN: 35 mg/dL — ABNORMAL HIGH (ref 6–20)
CO2: 22 mmol/L (ref 22–32)
Calcium: 7.7 mg/dL — ABNORMAL LOW (ref 8.9–10.3)
Chloride: 105 mmol/L (ref 98–111)
Creatinine, Ser: 2.94 mg/dL — ABNORMAL HIGH (ref 0.61–1.24)
GFR, Estimated: 25 mL/min — ABNORMAL LOW (ref >60)
Glucose, Bld: 129 mg/dL — ABNORMAL HIGH (ref 70–99)
Potassium: 3.1 mmol/L — ABNORMAL LOW (ref 3.5–5.1)
Sodium: 137 mmol/L (ref 135–145)
Total Bilirubin: 1 mg/dL (ref 0.3–1.2)
Total Protein: 5.7 g/dL — ABNORMAL LOW (ref 6.5–8.1)

## 2021-12-08 LAB — URINE CULTURE: Culture: <10000 — AB

## 2021-12-08 LAB — GLUCOSE, CAPILLARY
Glucose-Capillary: 134 mg/dL — ABNORMAL HIGH (ref 70–99)
Glucose-Capillary: 159 mg/dL — ABNORMAL HIGH (ref 70–99)
Glucose-Capillary: 139 mg/dL — ABNORMAL HIGH (ref 70–99)
Glucose-Capillary: 165 mg/dL — ABNORMAL HIGH (ref 70–99)

## 2021-12-08 LAB — COLD AGGLUTININ TITER: Cold Agglutinin Titer: NEGATIVE

## 2021-12-08 MED ORDER — HYDRALAZINE HCL 20 MG/ML IJ SOLN
5.0000 mg | Freq: Four times a day (QID) | INTRAMUSCULAR | Status: DC | PRN
Start: 2021-12-08 — End: 2021-12-10
  Administered 2021-12-08 – 2021-12-10 (×4): 5 mg via INTRAVENOUS
  Filled 2021-12-08 (×4): qty 1

## 2021-12-08 MED ORDER — AMLODIPINE BESYLATE 5 MG PO TABS
5.0000 mg | ORAL_TABLET | Freq: Every day | ORAL | Status: DC
  Administered 2021-12-08 – 2021-12-09 (×2): 5 mg via ORAL
  Filled 2021-12-08 (×2): qty 1

## 2021-12-08 MED ORDER — OXYCODONE HCL 5 MG PO TABS
5.0000 mg | ORAL_TABLET | Freq: Once | ORAL | Status: AC
  Administered 2021-12-08: 5 mg via ORAL
  Filled 2021-12-08: qty 1

## 2021-12-08 MED ORDER — FLUTICASONE PROPIONATE 50 MCG/ACT NA SUSP
2.0000 | Freq: Every day | NASAL | Status: DC
  Administered 2021-12-08 – 2021-12-09 (×2): 2 via NASAL
  Filled 2021-12-08: qty 16

## 2021-12-08 MED ORDER — INSULIN ASPART 100 UNIT/ML IJ SOLN
0.0000 [IU] | Freq: Three times a day (TID) | INTRAMUSCULAR | Status: DC
  Administered 2021-12-08: 2 [IU] via SUBCUTANEOUS
  Administered 2021-12-08: 1 [IU] via SUBCUTANEOUS
  Administered 2021-12-09: 2 [IU] via SUBCUTANEOUS
  Administered 2021-12-09 (×2): 3 [IU] via SUBCUTANEOUS
  Administered 2021-12-10: 2 [IU] via SUBCUTANEOUS

## 2021-12-08 MED ORDER — POTASSIUM CHLORIDE 10 MEQ/100ML IV SOLN
10.0000 meq | INTRAVENOUS | Status: AC
  Administered 2021-12-08 (×4): 10 meq via INTRAVENOUS
  Filled 2021-12-08 (×4): qty 100

## 2021-12-08 MED ORDER — OXYCODONE HCL 5 MG PO TABS
5.0000 mg | ORAL_TABLET | ORAL | Status: DC | PRN
  Administered 2021-12-08 – 2021-12-10 (×7): 5 mg via ORAL
  Filled 2021-12-08 (×7): qty 1

## 2021-12-08 NOTE — Progress Notes (Signed)
?[facility] ?Progress Note  ? ? ?Assessment/ Plan:   ?1. AKI 2/2 ATN ?Normal kidney function at baseline with no history of CKD. AKI likely caused by ATN in the setting of severe sepsis and concomitant daily NSAID use. FENa supportive of this. Kidney function significantly improved from yesterday so likely in recovery phase now. Encouraged PO fluid intake. We will sign off as continued improvement is anticipated. ? ?2. Severe sepsis 2/2 Klebsiella bacteremia - management per primary team. Currently on rocephin, pending sensitivities.  ? ?3. Resolving SBO - management per primary team. Tolerating diet. ? ?4. Lactic acidosis - secondary to severe sepsis, improving. ? ?5. Elevated liver enzymes - secondary to severe sepsis, improving. ? ?6. T2DM - A1c 7.8%. On metformin at home. TOC consult placed for PCP needs. ? ?Subjective:   ? ?Evaluated patient at bedside. Reports feeling good this morning. Was able to tolerate some breakfast this morning. Happy to hear kidney function is improving. No complaints or concerns at this time.   ? ?Objective:   ?BP (!) 143/86   Pulse (!) 104   Temp 99.2 ?F (37.3 ?C) (Axillary)   Resp 14   Ht 6' (1.829 m)   Wt 134.5 kg   SpO2 93%   BMI 40.22 kg/m?  ? ?Intake/Output Summary (Last 24 hours) at 12/08/2021 0936 ?Last data filed at 12/08/2021 0700 ?Gross per 24 hour  ?Intake 4500.3 ml  ?Output 2775 ml  ?Net 1725.3 ml  ? ?Weight change:  ? ?Physical Exam: ?Gen: obese middle aged male, lying in bed, NAD. ?CV: tachycardic rate, normal rhythm, no m/r/g. No peripheral edema noted. ?Resp: CTABL, normal work of breathing on RA.  ?Abd: soft, nondistended, nontender, hypoactive bowel sounds ?Skin: warm and dry. ?Neuro: AAOx3, no focal deficits noted. ? ?Imaging: ?CT HEAD WO CONTRAST ( ) ? ?Result Date: 12/06/2021 ?CLINICAL DATA:  Lethargy and diaphoresis. EXAM: CT HEAD WITHOUT CONTRAST TECHNIQUE: Contiguous axial images were obtained from the base of the skull through the  vertex without intravenous contrast. RADIATION DOSE REDUCTION: This exam was performed according to the departmental dose-optimization program which includes automated exposure control, adjustment of the mA and/or kV according to patient size and/or use of iterative reconstruction technique. COMPARISON:  None. FINDINGS: Brain: No evidence of acute infarction, hemorrhage, hydrocephalus, extra-axial collection or mass lesion/mass effect. Vascular: No hyperdense vessel or unexpected calcification. Skull: Normal. Negative for fracture or focal lesion. Sinuses/Orbits: There is marked severity bilateral ethmoid sinus mucosal thickening. A moderate sized sphenoid sinus air-fluid level is noted. Other: None. IMPRESSION: 1. No acute intracranial abnormality. 2. Bilateral ethmoid sinus and sphenoid sinus disease. Electronically Signed   By: Aram Candela M.D.   On: 12/06/2021 20:46  ? ?DG Chest Port 1 View ? ?Result Date: 12/06/2021 ?CLINICAL DATA:  Possible sepsis EXAM: PORTABLE CHEST 1 VIEW COMPARISON:  03/18/2021 FINDINGS: Cardiac shadow is enlarged but accentuated by the frontal technique. Lungs are well aerated bilaterally. No focal infiltrate or effusion is seen. No bony abnormality is noted. IMPRESSION: No active disease. Electronically Signed   By: Alcide Clever M.D.   On: 12/06/2021 19:07  ? ?DG Abd Portable 1V ? ?Result Date: 12/07/2021 ?CLINICAL DATA:  Follow-up evaluation of small-bowel obstruction. EXAM: PORTABLE ABDOMEN - 1 VIEW COMPARISON:  CT examination dated December 06, 2021 FINDINGS: There is generalized paucity of bowel gas, which may represent fluid-filled bowel loops in the setting of ileus or bowel obstruction. Mild degenerate disc disease of the lumbar spine. No acute osseous abnormality. IMPRESSION: Paucity  of bowel gas which may represent fluid-filled bowel loops in the settings of bowel obstruction and/or ileus. Clinical correlation is suggested. Electronically Signed   By: Larose Hires D.O.   On:  12/07/2021 10:36  ? ?CT CHEST ABDOMEN PELVIS WO CONTRAST ? ?Result Date: 12/06/2021 ?CLINICAL DATA:  Nausea and vomiting and sepsis. EXAM: CT CHEST, ABDOMEN AND PELVIS WITHOUT CONTRAST TECHNIQUE: Multidetector CT imaging of the chest, abdomen and pelvis was performed following the standard protocol without IV contrast. RADIATION DOSE REDUCTION: This exam was performed according to the departmental dose-optimization program which includes automated exposure control, adjustment of the mA and/or kV according to patient size and/or use of iterative reconstruction technique. COMPARISON:  CT scan 03/18/2021 FINDINGS: CT CHEST FINDINGS Cardiovascular: The heart is normal in size. No pericardial effusion. The aorta is normal in caliber. No dissection or atherosclerotic calcification. No definite coronary artery calcifications. Mediastinum/Nodes: No mediastinal or hilar mass or adenopathy. Some fluid noted in the esophagus. Small hiatal hernia. Lungs/Pleura: Dependent atelectasis but no infiltrates or effusions. No worrisome pulmonary lesions. Sub 5 mm pulmonary nodule adjacent to the major fissure in the right lower lobe is most likely a lymph node. No follow-up is necessary. Musculoskeletal: He the no significant bony findings. CT ABDOMEN PELVIS FINDINGS Hepatobiliary: No hepatic lesions or intrahepatic biliary dilatation. The gallbladder is unremarkable. No common bile duct dilatation. Pancreas: No mass, inflammation or ductal dilatation. Spleen: Normal size.  No focal lesions. Adrenals/Urinary Tract: Adrenal glands are normal. No renal lesions, renal calculi or hydronephrosis. The bladder is slightly thick-walled but this is likely due to lack of distension. Bladder mass or calculi. Stomach/Bowel: The stomach is slightly distended with fluid and a small amount of air. The duodenum and small bowel are also dilated and fluid-filled with scattered air-fluid levels consistent with a small-bowel obstruction. There is a  transition to normal/decompressed distal and terminal ileum. This could be due to adhesions in the patient has had prior surgery if not, there could be a small internal hernia. No pneumatosis, free air or portal venous gas. The colon is unremarkable. No mass, inflammation or obstruction. It is relatively decompressed. There are moderate-sized sigmoid colon diverticuli but no findings for acute diverticulitis. Vascular/Lymphatic: Scattered aortic calcifications but no aneurysm. No mesenteric or retroperitoneal mass or adenopathy. Reproductive: The prostate gland and seminal vesicles are unremarkable. Other: No pelvic mass or adenopathy. No free pelvic fluid collections. No inguinal mass or adenopathy. Small periumbilical abdominal wall hernia containing fat. Musculoskeletal: No significant bony findings. IMPRESSION: 1. CT findings consistent with a small-bowel obstruction with transition from dilated to nondilated bowel in the right lower quadrant. No mass or obvious cause. This could be due to adhesions if the patient has had prior surgery if not, there could be a small internal hernia. 2. No findings for compromised bowel such as pneumatosis, free air or portal venous gas. Electronically Signed   By: Rudie Meyer M.D.   On: 12/06/2021 21:03   ? ?Labs: ?BMET ?Recent Labs  ?Lab 12/06/21 ?1845 12/06/21 ?1857 12/06/21 ?2231 12/07/21 ?5366 12/08/21 ?0328  ?NA 138 137  --  140 137  ?K 3.3* 3.2*  --  3.8 3.1*  ?CL 99  --   --  102 105  ?CO2 19*  --   --  20* 22  ?GLUCOSE 165*  --   --  157* 129*  ?BUN 29*  --   --  41* 35*  ?CREATININE 4.44*  --  4.92* 4.97* 2.94*  ?CALCIUM 8.9  --   --  8.3* 7.7*  ? ?CBC ?Recent Labs  ?Lab 12/06/21 ?1845 12/06/21 ?1857 12/06/21 ?2231 12/07/21 ?[id number] 12/08/21 ?0328  ?WBC 12.5*  --  13.2* 22.4* 22.3*  ?NEUTROABS 11.5*  --   --   --  15.6*  ?HGB 16.6 16.7 15.7 15.4 14.5  ?HCT 49.4 49.0 45.9 44.9 41.8  ?MCV 87.6  --  87.4 85.4 84.8  ?PLT 165  --  112* 95* 87*  ? ? ?Medications:   ? ?  Chlorhexidine Gluconate Cloth  6 each Topical Daily  ? heparin  5,000 Units Subcutaneous Q8H  ? ? ?Merrilyn PumaSagar Itzamar Traynor, MD ?12/08/2021, 9:36 AM  ? ?

## 2021-12-08 NOTE — Assessment & Plan Note (Signed)
See under severe sepsis. °

## 2021-12-08 NOTE — Assessment & Plan Note (Addendum)
HbA1c 7.8.  Continue metformin at discharge.  Follow-up with PCP. ?

## 2021-12-08 NOTE — Assessment & Plan Note (Signed)
Estimated body mass index is 40.22 kg/m? as calculated from the following: ?  Height as of this encounter: 6' (1.829 m). ?  Weight as of this encounter: 134.5 kg. ? ?

## 2021-12-08 NOTE — Assessment & Plan Note (Addendum)
HCTZ to be held.  Will be discharged on amlodipine and hydralazine. ?

## 2021-12-08 NOTE — Assessment & Plan Note (Addendum)
Most likely secondary to sepsis.  LFTs are improving.  Abdomen is benign.  CT of the abdomen pelvis does not show any abnormalities in the hepatobiliary system. ?HIV nonreactive. ?Hepatitis panel is unremarkable ?

## 2021-12-08 NOTE — Hospital Course (Signed)
56 year old African-American male with past medical history of hypertension and diabetes who was brought into the emergency department due to confusion and partial responsiveness.  Evaluation revealed that patient was febrile.  Concern for sepsis was identified.  Noted to have abnormal UA.  Subsequently noted to have bacteremia.  CT scan of the chest abdomen pelvis raised concern for small bowel obstruction.  LFTs were abnormal.  Patient was admitted to the intensive care unit.  Started on broad-spectrum antibiotics.  Stabilized and then transferred to hospitalist service.   ?

## 2021-12-08 NOTE — Progress Notes (Signed)
eLink Physician-Brief Progress Note ?Patient Name: Casey Garrett ?DOB: 1965-09-18 ?MRN: [id number] ? ? ?Date of Service ? 12/08/2021  ?HPI/Events of Note ? Hypokalemia - K+ = 3.1 and Creatinine - 2.94 (Improved from 4.97).  ?eICU Interventions ? Will cautiously replace K+.   ? ? ? ?Intervention Category ?Major Interventions: Electrolyte abnormality - evaluation and management ? ?Kelten Enochs Dennard Nip ?12/08/2021, 5:49 AM ?

## 2021-12-08 NOTE — Progress Notes (Signed)
eLink Physician-Brief Progress Note ?Patient Name: Casey Garrett ?DOB: Aug 25, 1966 ?MRN: [id number] ? ? ?Date of Service ? 12/08/2021  ?HPI/Events of Note ? Patient c/o headache.   ?eICU Interventions ? Plan: ?Oxycodone IR 5 mg PO X 1 now.   ? ? ? ?Intervention Category ?Major Interventions: Other: ? ?Casey Garrett ?12/08/2021, 2:53 AM ?

## 2021-12-08 NOTE — Assessment & Plan Note (Addendum)
Thought to be secondary to prerenal azotemia in the setting of hydrochlorothiazide use prior to admission. ?Presented with creatinine of 4.4.  Went up to 4.97.  Seen by nephrology.  IV fluids administered.  Creatinine improving and almost back to baseline. ?

## 2021-12-08 NOTE — Assessment & Plan Note (Addendum)
Noted to have Klebsiella bacteremia.  Abdominal source or UTI suspected.  Urine culture without any growth.  Initially started on broad-spectrum antibiotics.  Narrowed to ceftriaxone on 4/12.  ?Klebsiella sensitivities were reviewed.  Patient was changed over to Keflex.  Pyridium for dysuria. ?

## 2021-12-08 NOTE — TOC Initial Note (Signed)
Transition of Care (TOC) - Initial/Assessment Note  ? ? ?Patient Details  ?Name: Casey Garrett ?MRN: [id number] ?Date of Birth: 07-Dec-1965 ? ?Transition of Care (TOC) CM/SW Contact:    ?Easton Sivertson D Itzabella Sorrels, Student-Social Work ?Phone Number: ?12/08/2021, 1:42 PM ? ?Clinical Narrative:                 ?CSW intern visited patient at bedside to provide substance use treatment resources and to assess needs. The patient states they use alcohol, marijuana, and ecstasy for recreational use only, and that it is not something he currently wants/needs treatment for. CSW intern provided  a resource packet in case the patient is ever in need, he will be equipped with the necessary resources.  ? ?Expected Discharge Plan: Home/Self Care ?Barriers to Discharge: No Barriers Identified ? ? ?Patient Goals and CMS Choice ?  ?  ?  ? ?Expected Discharge Plan and Services ?Expected Discharge Plan: Home/Self Care ?  ?  ?  ?Living arrangements for the past 2 months: Single Family Home ?                ?  ?  ?  ?  ?  ?  ?  ?  ?  ?  ? ?Prior Living Arrangements/Services ?Living arrangements for the past 2 months: Single Family Home ?  ?Patient language and need for interpreter reviewed:: Yes ?       ?  ?  ?  ?  ? ?Activities of Daily Living ?  ?ADL Screening (condition at time of admission) ?Patient's cognitive ability adequate to safely complete daily activities?: Yes ?Is the patient deaf or have difficulty hearing?: No ?Does the patient have difficulty seeing, even when wearing glasses/contacts?: No ?Does the patient have difficulty concentrating, remembering, or making decisions?: No ?Patient able to express need for assistance with ADLs?: Yes ?Does the patient have difficulty dressing or bathing?: No ?Independently performs ADLs?: Yes (appropriate for developmental age) ?Does the patient have difficulty walking or climbing stairs?: No ?Weakness of Legs: None ?Weakness of Arms/Hands: None ? ?Permission Sought/Granted ?  ?  ?   ?   ?   ?    ? ?Emotional Assessment ?Appearance:: Appears stated age ?Attitude/Demeanor/Rapport: Engaged ?Affect (typically observed): Appropriate ?Orientation: : Oriented to Self, Oriented to Place, Oriented to  Time, Oriented to Situation ?Alcohol / Substance Use: Alcohol Use, Illicit Drugs ?Psych Involvement: No (comment) ? ?Admission diagnosis:  Small bowel obstruction (HCC) [K56.609] ?Septic shock (HCC) [A41.9, R65.21] ?Sepsis with acute renal failure, due to unspecified organism, unspecified acute renal failure type, unspecified whether septic shock present (HCC) [A41.9, R65.20, N17.9] ?Patient Active Problem List  ? Diagnosis Date Noted  ? AKI (acute kidney injury) (HCC) 12/08/2021  ? Abnormal LFTs 12/08/2021  ? Lactic acidosis 12/08/2021  ? Bacteremia due to Klebsiella pneumoniae 12/08/2021  ? Polysubstance abuse (HCC) 12/08/2021  ? Acute metabolic encephalopathy 12/08/2021  ? Hypokalemia 12/08/2021  ? Essential hypertension 12/08/2021  ? Diabetes mellitus type 2 in obese (HCC) 12/08/2021  ? Obesity, Class III, BMI 40-49.9 (morbid obesity) (HCC) 12/08/2021  ? Severe sepsis (HCC) 12/06/2021  ? Small bowel obstruction (HCC)   ? ?PCP:  Pcp, No ?Pharmacy:   ?[id number] - [place], [place] - [address] ?[address] ?[place] [address] ?Phone: [phone] Fax: [phone] ? ? ? ? ?Social Determinants of Health (SDOH) Interventions ?  ? ?Readmission Risk Interventions ?   ? View : No data to display.  ?  ?  ?  ? ? ? ?

## 2021-12-08 NOTE — Assessment & Plan Note (Addendum)
Appears to have resolved.  Patient was seen by general surgery. ?

## 2021-12-08 NOTE — Progress Notes (Signed)
? ?[facility] ?PROGRESS NOTE ? ? ?Casey Garrett PXT:[id number] DOB: 05/23/1966 DOA: 12/06/2021  2 ?DOS: the patient was seen and examined on 12/08/2021 ? ?PCP: Pcp, No ? ?Brief History and Hospital Course:  ?56 year old African-American male with past medical history of hypertension and diabetes who was brought into the emergency department due to confusion and partial responsiveness.  Evaluation revealed that patient was febrile.  Concern for sepsis was identified.  Noted to have abnormal UA.  Subsequently noted to have bacteremia.  CT scan of the chest abdomen pelvis raised concern for small bowel obstruction.  LFTs were abnormal.  Patient was admitted to the intensive care unit.  Started on broad-spectrum antibiotics.  Stabilized and then transferred to hospitalist service.   ? ?Consultants: Nephrology.  General surgery: Signed off. ? ?Procedures: None yet ? ? ? ?Subjective: ?Patient mentions that he feels better.  Has had multiple bowel movements.  Some pain is noted with urination.  No nausea reported today.  No shortness of breath. ? ? ? ?Assessment/Plan: ? ? ?* Severe sepsis (HCC) ?Noted to have Klebsiella bacteremia.  Abdominal source of UTI suspected.  Urine culture without any growth.  Initially started on broad-spectrum antibiotics.  Narrowed to ceftriaxone on 4/12.  Waiting on sensitivities.  Sepsis physiology has improved.  Noted to be afebrile.  WBC remains elevated. ? ?Bacteremia due to Klebsiella pneumoniae ?See under severe sepsis. ? ?Lactic acidosis ?Secondary to sepsis.  Has been downtrending. ? ?AKI (acute kidney injury) (HCC) ?Thought to be secondary to prerenal azotemia in the setting of hydrochlorothiazide use prior to admission. ?Presented with creatinine of 4.4.  Went up to 4.97.  Seen by nephrology.  IV fluids administered.  Renal function is improving.  Creatinine is down to 2.94.  Monitor urine output. ?Avoid nephrotoxic agents. ? ?Small bowel obstruction (HCC) ?Appears to be  resolving.  Seen by general surgery.  Has had multiple bowel movements.  Abdomen is benign. ?Noted to be on clear liquid diet.  Could be advanced to full liquids today. ? ?Acute metabolic encephalopathy ?Likely secondary to sepsis.  Now resolved. ? ?Diabetes mellitus type 2 in obese [facility]) ?On metformin prior to admission.  Monitor CBGs.  HbA1c 7.8. ? ?Essential hypertension ?On amlodipine and HCTZ prior to admission.  Both are on hold.  Monitor blood pressures. ? ?Abnormal LFTs ?Most likely secondary to sepsis.  LFTs are improving.  Abdomen is benign.  CT of the abdomen pelvis does not show any abnormalities in the hepatobiliary system. ?Check hepatitis panel.  HIV nonreactive. ? ?Hypokalemia ?Potassium being repleted. ? ?Polysubstance abuse (HCC) ?Urine drug screen was positive for amphetamines and THC.  Will need counseling. ? ?Obesity, Class III, BMI 40-49.9 (morbid obesity) (HCC) ?Estimated body mass index is 40.22 kg/m? as calculated from the following: ?  Height as of this encounter: 6' (1.829 m). ?  Weight as of this encounter: 134.5 kg. ? ? ? ? ? ?DVT Prophylaxis: Subcutaneous heparin ?Code Status: Full code ?Family Communication: Discussed with patient ?Disposition Plan: Start mobilizing. ? ?Status is: Inpatient ?Remains inpatient appropriate because: Acute kidney injury, recovery from small bowel obstruction, sepsis with bacteremia ? ? ? ? ?Medications: Scheduled: ? Chlorhexidine Gluconate Cloth  6 each Topical Daily  ? heparin  5,000 Units Subcutaneous Q8H  ? insulin aspart  0-9 Units Subcutaneous TID WC  ? ?Continuous: ? cefTRIAXone (ROCEPHIN)  IV Stopped (12/07/21 1917)  ? lactated ringers 125 mL/hr at 12/08/21 0700  ? potassium chloride 10 mEq (12/08/21 0855)  ? ?  ZOX:WRUEAVWUPRN:docusate sodium, polyethylene glycol ? ?Antibiotics: ?Anti-infectives (From admission, onward)  ? ? Start     Dose/Rate Route Frequency Ordered Stop  ? 12/07/21 1900  cefTRIAXone (ROCEPHIN) 2 g in sodium chloride 0.9 % 100 mL IVPB       ?  2 g ?200 mL/hr over 30 Minutes Intravenous Every 24 hours 12/07/21 0821 12/13/21 1859  ? 12/07/21 1800  ceFEPIme (MAXIPIME) 2 g in sodium chloride 0.9 % 100 mL IVPB  Status:  Discontinued       ? 2 g ?200 mL/hr over 30 Minutes Intravenous Every 24 hours 12/06/21 2018 12/07/21 0821  ? 12/06/21 2017  vancomycin variable dose per unstable renal function (pharmacist dosing)  Status:  Discontinued       ?  Does not apply See admin instructions 12/06/21 2018 12/07/21 0742  ? 12/06/21 1900  ceFEPIme (MAXIPIME) 2 g in sodium chloride 0.9 % 100 mL IVPB       ? 2 g ?200 mL/hr over 30 Minutes Intravenous  Once 12/06/21 1846 12/06/21 1936  ? 12/06/21 1900  metroNIDAZOLE (FLAGYL) IVPB 500 mg       ? 500 mg ?100 mL/hr over 60 Minutes Intravenous  Once 12/06/21 1846 12/06/21 2106  ? 12/06/21 1900  vancomycin (VANCOCIN) IVPB 1000 mg/200 mL premix  Status:  Discontinued       ? 1,000 mg ?200 mL/hr over 60 Minutes Intravenous  Once 12/06/21 1846 12/06/21 1856  ? 12/06/21 1900  vancomycin (VANCOREADY) IVPB 2000 mg/400 mL       ? 2,000 mg ?200 mL/hr over 120 Minutes Intravenous  Once 12/06/21 1856 12/06/21 2306  ? ?  ? ? ?Objective: ? ?Vital Signs ? ?Vitals:  ? 12/08/21 0500 12/08/21 0600 12/08/21 0700 12/08/21 0800  ?BP: 136/78 137/87 (!) 143/86   ?Pulse: (!) 106 100 (!) 104   ?Resp: 18 19 14    ?Temp:    99.2 ?F (37.3 ?C)  ?TempSrc:    Axillary  ?SpO2: 93% 93% 93%   ?Weight:      ?Height:      ? ? ?Intake/Output Summary (Last 24 hours) at 12/08/2021 1000 ?Last data filed at 12/08/2021 0700 ?Gross per 24 hour  ?Intake 4500.3 ml  ?Output 2650 ml  ?Net 1850.3 ml  ? ?Filed Weights  ? 12/07/21 1800 12/08/21 0300  ?Weight: 102.1 kg 134.5 kg  ? ? ?General appearance: Awake alert.  In no distress ?Resp: Clear to auscultation bilaterally.  Normal effort ?Cardio: S1-S2 is normal regular.  No S3-S4.  No rubs murmurs or bruit ?GI: Abdomen is soft.  Nontender nondistended.  Bowel sounds are present normal.  No masses organomegaly ?Extremities: No  edema.  Full range of motion of lower extremities. ?Neurologic: Alert and oriented x3.  No focal neurological deficits.  ? ? ?Lab Results: ? ?Data Reviewed: I have personally reviewed labs and imaging study reports ? ?CBC: ?Recent Labs  ?Lab 12/06/21 ?1845 12/06/21 ?1857 12/06/21 ?2231 12/07/21 ?[id number] 12/08/21 ?0328  ?WBC 12.5*  --  13.2* 22.4* 22.3*  ?NEUTROABS 11.5*  --   --   --  15.6*  ?HGB 16.6 16.7 15.7 15.4 14.5  ?HCT 49.4 49.0 45.9 44.9 41.8  ?MCV 87.6  --  87.4 85.4 84.8  ?PLT 165  --  112* 95* 87*  ? ? ?Basic Metabolic Panel: ?Recent Labs  ?Lab 12/06/21 ?1845 12/06/21 ?1857 12/06/21 ?2231 12/07/21 ?[id number] 12/08/21 ?0328  ?NA 138 137  --  140 137  ?K 3.3* 3.2*  --  3.8 3.1*  ?CL 99  --   --  102 105  ?CO2 19*  --   --  20* 22  ?GLUCOSE 165*  --   --  157* 129*  ?BUN 29*  --   --  41* 35*  ?CREATININE 4.44*  --  4.92* 4.97* 2.94*  ?CALCIUM 8.9  --   --  8.3* 7.7*  ? ? ?GFR: ?Estimated Creatinine Clearance: 40.8 mL/min (A) (by C-G formula based on SCr of 2.94 mg/dL (H)). ? ?Liver Function Tests: ?Recent Labs  ?Lab 12/06/21 ?1845 12/07/21 ?5956 12/08/21 ?0328  ?AST 899* 305* 137*  ?ALT 478* 346* 224*  ?ALKPHOS 122 104 113  ?BILITOT 1.2 1.6* 1.0  ?PROT 6.3* 5.7* 5.7*  ?ALBUMIN 3.2* 2.8* 2.6*  ? ? ?Recent Labs  ?Lab 12/06/21 ?1845  ?LIPASE 25  ? ?Recent Labs  ?Lab 12/06/21 ?1846  ?AMMONIA 29  ? ? ?Coagulation Profile: ?Recent Labs  ?Lab 12/06/21 ?1845  ?INR 1.2  ? ? ? ?HbA1C: ?Recent Labs  ?  12/07/21 ?1507  ?HGBA1C 7.8*  ? ? ?CBG: ?Recent Labs  ?Lab 12/07/21 ?1610 12/08/21 ?0328  ?GLUCAP 106* 134*  ? ? ? ?Thyroid Function Tests: ?Recent Labs  ?  12/06/21 ?1846  ?TSH 3.800  ? ? ? ?Recent Results (from the past 240 hour(s))  ?Resp Panel by RT-PCR (Flu A&B, Covid) Nasopharyngeal Swab     Status: None  ? Collection Time: 12/06/21  6:45 PM  ? Specimen: Nasopharyngeal Swab; Nasopharyngeal(NP) swabs in vial transport medium  ?Result Value Ref Range Status  ? SARS Coronavirus 2 by RT PCR NEGATIVE NEGATIVE Final  ?   Comment: (NOTE) ?SARS-CoV-2 target nucleic acids are NOT DETECTED. ? ?The SARS-CoV-2 RNA is generally detectable in upper respiratory ?specimens during the acute phase of infection. The lowest ?concentration of S

## 2021-12-08 NOTE — Assessment & Plan Note (Signed)
Likely secondary to sepsis.  Now resolved. ?

## 2021-12-08 NOTE — Assessment & Plan Note (Addendum)
Potassium being repleted.  Magnesium 2.1. ?

## 2021-12-08 NOTE — Assessment & Plan Note (Addendum)
Urine drug screen was positive for amphetamines and THC.  Complained of headache.  CT head at the time of admission was unremarkable.  No neurological deficits ?

## 2021-12-08 NOTE — Assessment & Plan Note (Addendum)
Secondary to sepsis.  Was noted to be downtrending. ?

## 2021-12-09 LAB — CULTURE, BLOOD (ROUTINE X 2)
Special Requests: ADEQUATE
Special Requests: ADEQUATE

## 2021-12-09 LAB — COMPREHENSIVE METABOLIC PANEL
ALT: 130 U/L — ABNORMAL HIGH (ref 0–44)
AST: 55 U/L — ABNORMAL HIGH (ref 15–41)
Albumin: 2.5 g/dL — ABNORMAL LOW (ref 3.5–5.0)
Alkaline Phosphatase: 134 U/L — ABNORMAL HIGH (ref 38–126)
Anion gap: 8 (ref 5–15)
BUN: 24 mg/dL — ABNORMAL HIGH (ref 6–20)
CO2: 24 mmol/L (ref 22–32)
Calcium: 7.9 mg/dL — ABNORMAL LOW (ref 8.9–10.3)
Chloride: 103 mmol/L (ref 98–111)
Creatinine, Ser: 1.6 mg/dL — ABNORMAL HIGH (ref 0.61–1.24)
GFR, Estimated: 51 mL/min — ABNORMAL LOW (ref >60)
Glucose, Bld: 202 mg/dL — ABNORMAL HIGH (ref 70–99)
Potassium: 3.3 mmol/L — ABNORMAL LOW (ref 3.5–5.1)
Sodium: 135 mmol/L (ref 135–145)
Total Bilirubin: 0.7 mg/dL (ref 0.3–1.2)
Total Protein: 5.7 g/dL — ABNORMAL LOW (ref 6.5–8.1)

## 2021-12-09 LAB — CBC
HCT: 40.4 % (ref 39.0–52.0)
Hemoglobin: 14.1 g/dL (ref 13.0–17.0)
MCH: 29.7 pg (ref 26.0–34.0)
MCHC: 34.9 g/dL (ref 30.0–36.0)
MCV: 85.1 fL (ref 80.0–100.0)
Platelets: 104 10*3/uL — ABNORMAL LOW (ref 150–400)
RBC: 4.75 MIL/uL (ref 4.22–5.81)
RDW: 13.5 % (ref 11.5–15.5)
WBC: 22.5 10*3/uL — ABNORMAL HIGH (ref 4.0–10.5)
nRBC: 0 % (ref 0.0–0.2)

## 2021-12-09 LAB — HEPATITIS PANEL, ACUTE
HCV Ab: NONREACTIVE
Hep A IgM: NONREACTIVE
Hep B C IgM: NONREACTIVE
Hepatitis B Surface Ag: NONREACTIVE

## 2021-12-09 LAB — GLUCOSE, CAPILLARY
Glucose-Capillary: 233 mg/dL — ABNORMAL HIGH (ref 70–99)
Glucose-Capillary: 207 mg/dL — ABNORMAL HIGH (ref 70–99)
Glucose-Capillary: 171 mg/dL — ABNORMAL HIGH (ref 70–99)
Glucose-Capillary: 272 mg/dL — ABNORMAL HIGH (ref 70–99)

## 2021-12-09 LAB — MAGNESIUM: Magnesium: 2.1 mg/dL (ref 1.7–2.4)

## 2021-12-09 MED ORDER — ACETAMINOPHEN 325 MG PO TABS
650.0000 mg | ORAL_TABLET | Freq: Four times a day (QID) | ORAL | Status: DC | PRN
Start: 2021-12-09 — End: 2021-12-10
  Administered 2021-12-09 (×2): 650 mg via ORAL
  Filled 2021-12-09 (×2): qty 2

## 2021-12-09 MED ORDER — POTASSIUM CHLORIDE CRYS ER 20 MEQ PO TBCR
40.0000 meq | EXTENDED_RELEASE_TABLET | Freq: Once | ORAL | Status: AC
Start: 2021-12-09 — End: 2021-12-09
  Administered 2021-12-09: 40 meq via ORAL
  Filled 2021-12-09: qty 2

## 2021-12-09 MED ORDER — CEPHALEXIN 500 MG PO CAPS
500.0000 mg | ORAL_CAPSULE | Freq: Three times a day (TID) | ORAL | Status: DC
  Administered 2021-12-09 – 2021-12-10 (×3): 500 mg via ORAL
  Filled 2021-12-09 (×3): qty 1

## 2021-12-09 NOTE — Progress Notes (Signed)
PT Cancellation Note ? ?Patient Details ?Name: Casey Garrett ?MRN: [id number] ?DOB: Jun 28, 1966 ? ? ?Cancelled Treatment:    Reason Eval/Treat Not Completed: Fatigue/lethargy limiting ability to participate; requesting to sleep and for PT to return.  Will continue attempts.  ? ? ?Elray Mcgregor ?12/09/2021, 10:56 AM ?Sheran Lawless, PT ?Acute Rehabilitation Services ?Pager:[phone] ?Office:[phone] ?12/09/2021 ? ?

## 2021-12-09 NOTE — Progress Notes (Signed)
Mobility Specialist Progress Note: ? ? 12/09/21 1551  ?Mobility  ?Activity Stood at bedside  ?Level of Assistance Independent after set-up  ?Assistive Device None  ?Distance Ambulated (ft) 4 ft  ?Activity Response Tolerated well  ?$Mobility charge 1 Mobility  ? ?Pt received EOB. Pt needed to use urinal, Pt stood and began using urinal. He stated he was having a burning sensation when urinating, RN notified. Pt declining further ambulation. Left EOB with call bell in reach and all needs met.  ? ?Vernon Ariel ?Mobility Specialist ?Primary Phone [phone] ? ?

## 2021-12-09 NOTE — Plan of Care (Signed)

## 2021-12-09 NOTE — Progress Notes (Signed)
Pt transferred here from 4N PCU via wc. A/Ox4, ambulated to BR. Denies nausea/pain at this time.  ?

## 2021-12-09 NOTE — Progress Notes (Signed)
? ?[facility] ?PROGRESS NOTE ? ? ?Casey PlumberKeith B Garrett [id number] DOB: 10/14/1965 DOA: 12/06/2021  3 ?DOS: the patient was seen and examined on 12/09/2021 ? ?PCP: Pcp, No ? ?Brief History and Hospital Course:  ?56 year old African-American male with past medical history of hypertension and diabetes who was brought into the emergency department due to confusion and partial responsiveness.  Evaluation revealed that patient was febrile.  Concern for sepsis was identified.  Noted to have abnormal UA.  Subsequently noted to have bacteremia.  CT scan of the chest abdomen pelvis raised concern for small bowel obstruction.  LFTs were abnormal.  Patient was admitted to the intensive care unit.  Started on broad-spectrum antibiotics.  Stabilized and then transferred to hospitalist service.   ? ?Consultants: Nephrology.  General surgery: Signed off. ? ?Procedures: None yet ? ? ? ?Subjective: ?Patient mentions that he is feeling well.  Occasional headache.  No nausea vomiting.  Tolerating his diet.  Good urine output.  Denies any chest pain or shortness of breath. ? ? ? ?Assessment/Plan: ? ? ?* Severe sepsis (HCC) ?Noted to have Klebsiella bacteremia.  Abdominal source or UTI suspected.  Urine culture without any growth.  Initially started on broad-spectrum antibiotics.  Narrowed to ceftriaxone on 4/12.  ?Consider midis reviewed.  WBC remains elevated.  Remains afebrile. ?Transition to oral antibiotics, Keflex. ?Sepsis physiology has resolved. ? ?Bacteremia due to Klebsiella pneumoniae ?See under severe sepsis. ? ?Lactic acidosis ?Secondary to sepsis.  Has been downtrending. ? ?AKI (acute kidney injury) (HCC) ?Thought to be secondary to prerenal azotemia in the setting of hydrochlorothiazide use prior to admission. ?Presented with creatinine of 4.4.  Went up to 4.97.  Seen by nephrology.  IV fluids administered.  ?Renal function gradually improving.  Creatinine down to 1.6.  Cut back on IV fluids.  Monitor urine output.   Avoid nephrotoxic agents.  ? ?Small bowel obstruction (HCC) ?Appears to have resolved.  Patient was seen by general surgery.  Diet being gradually advanced.  Has had multiple bowel movements.   ? ?Acute metabolic encephalopathy ?Likely secondary to sepsis.  Now resolved. ? ?Diabetes mellitus type 2 in obese [facility](HCC) ?On metformin prior to admission.  Monitor CBGs.  HbA1c 7.8. ? ?Essential hypertension ?Amlodipine was resumed yesterday.  Continue to monitor blood pressures.  HCTZ remains on hold.   ? ?Abnormal LFTs ?Most likely secondary to sepsis.  LFTs are improving.  Abdomen is benign.  CT of the abdomen pelvis does not show any abnormalities in the hepatobiliary system. ?HIV nonreactive. ?Hepatitis panel is unremarkable ? ?Hypokalemia ?Potassium being repleted.  Magnesium 2.1. ? ?Polysubstance abuse (HCC) ?Urine drug screen was positive for amphetamines and THC.  Will need counseling. ?Complains of headache.  CT head at the time of admission was unremarkable.  No neurological deficits ? ?Obesity, Class III, BMI 40-49.9 (morbid obesity) (HCC) ?Estimated body mass index is 40.22 kg/m? as calculated from the following: ?  Height as of this encounter: 6' (1.829 m). ?  Weight as of this encounter: 134.5 kg. ? ? ? ? ? ?DVT Prophylaxis: Subcutaneous heparin ?Code Status: Full code ?Family Communication: Discussed with patient ?Disposition Plan: Ambulate in the hallway ? ?Status is: Inpatient ?Remains inpatient appropriate because: Acute kidney injury, recovery from small bowel obstruction, sepsis with bacteremia ? ? ? ? ?Medications: Scheduled: ? amLODipine  5 mg Oral Daily  ? fluticasone  2 spray Each Nare Daily  ? heparin  5,000 Units Subcutaneous Q8H  ? insulin aspart  0-9 Units Subcutaneous TID  WC  ? ?Continuous: ? cefTRIAXone (ROCEPHIN)  IV Stopped (12/08/21 1903)  ? lactated ringers 75 mL/hr at 12/09/21 1610  ? ?RUE:[id number], docusate sodium, hydrALAZINE, oxyCODONE, polyethylene  glycol ? ?Antibiotics: ?Anti-infectives (From admission, onward)  ? ? Start     Dose/Rate Route Frequency Ordered Stop  ? 12/07/21 1900  cefTRIAXone (ROCEPHIN) 2 g in sodium chloride 0.9 % 100 mL IVPB       ? 2 g ?200 mL/hr over 30 Minutes Intravenous Every 24 hours 12/07/21 0821 12/13/21 1859  ? 12/07/21 1800  ceFEPIme (MAXIPIME) 2 g in sodium chloride 0.9 % 100 mL IVPB  Status:  Discontinued       ? 2 g ?200 mL/hr over 30 Minutes Intravenous Every 24 hours 12/06/21 2018 12/07/21 0821  ? 12/06/21 2017  vancomycin variable dose per unstable renal function (pharmacist dosing)  Status:  Discontinued       ?  Does not apply See admin instructions 12/06/21 2018 12/07/21 0742  ? 12/06/21 1900  ceFEPIme (MAXIPIME) 2 g in sodium chloride 0.9 % 100 mL IVPB       ? 2 g ?200 mL/hr over 30 Minutes Intravenous  Once 12/06/21 1846 12/06/21 1936  ? 12/06/21 1900  metroNIDAZOLE (FLAGYL) IVPB 500 mg       ? 500 mg ?100 mL/hr over 60 Minutes Intravenous  Once 12/06/21 1846 12/06/21 2106  ? 12/06/21 1900  vancomycin (VANCOCIN) IVPB 1000 mg/200 mL premix  Status:  Discontinued       ? 1,000 mg ?200 mL/hr over 60 Minutes Intravenous  Once 12/06/21 1846 12/06/21 1856  ? 12/06/21 1900  vancomycin (VANCOREADY) IVPB 2000 mg/400 mL       ? 2,000 mg ?200 mL/hr over 120 Minutes Intravenous  Once 12/06/21 1856 12/06/21 2306  ? ?  ? ? ?Objective: ? ?Vital Signs ? ?Vitals:  ? 12/09/21 0200 12/09/21 0255 12/09/21 0430 12/09/21 0802  ?BP: (!) 152/96 (!) 153/95 (!) 147/90 (!) 151/95  ?Pulse: (!) 102 (!) 103 94 99  ?Resp: 16 (!) 21 20 17   ?Temp:  97.6 ?F (36.4 ?C) 98.2 ?F (36.8 ?C) 97.9 ?F (36.6 ?C)  ?TempSrc:  Oral Oral Oral  ?SpO2: 92% 95% 96% 92%  ?Weight:      ?Height:      ? ? ?Intake/Output Summary (Last 24 hours) at 12/09/2021 1053 ?Last data filed at 12/09/2021 [phone] ?Gross per 24 hour  ?Intake 2961.23 ml  ?Output 2325 ml  ?Net 636.23 ml  ? ? ?Filed Weights  ? 12/07/21 1800 12/08/21 0300  ?Weight: 102.1 kg 134.5 kg  ? ? ?General appearance:  Awake alert.  In no distress ?Resp: Clear to auscultation bilaterally.  Normal effort ?Cardio: S1-S2 is normal regular.  No S3-S4.  No rubs murmurs or bruit ?GI: Abdomen is soft.  Nontender nondistended.  Bowel sounds are present normal.  No masses organomegaly ?Extremities: No edema.  Full range of motion of lower extremities. ?Neurologic: Alert and oriented x3.  No focal neurological deficits.  ? ? ?Lab Results: ? ?Data Reviewed: I have personally reviewed labs and imaging study reports ? ?CBC: ?Recent Labs  ?Lab 12/06/21 ?1845 12/06/21 ?1857 12/06/21 ?2231 12/07/21 ?02/06/22 12/08/21 ?0328 12/09/21 ?12/11/21  ?WBC 12.5*  --  13.2* 22.4* 22.3* 22.5*  ?NEUTROABS 11.5*  --   --   --  15.6*  --   ?HGB 16.6 16.7 15.7 15.4 14.5 14.1  ?HCT 49.4 49.0 45.9 44.9 41.8 40.4  ?MCV 87.6  --  87.4 85.4 84.8  85.1  ?PLT 165  --  112* 95* 87* 104*  ? ? ? ?Basic Metabolic Panel: ?Recent Labs  ?Lab 12/06/21 ?1845 12/06/21 ?1857 12/06/21 ?2231 12/07/21 ?1062 12/08/21 ?0328 12/09/21 ?6948  ?NA 138 137  --  140 137 135  ?K 3.3* 3.2*  --  3.8 3.1* 3.3*  ?CL 99  --   --  102 105 103  ?CO2 19*  --   --  20* 22 24  ?GLUCOSE 165*  --   --  157* 129* 202*  ?BUN 29*  --   --  41* 35* 24*  ?CREATININE 4.44*  --  4.92* 4.97* 2.94* 1.60*  ?CALCIUM 8.9  --   --  8.3* 7.7* 7.9*  ?MG  --   --   --   --   --  2.1  ? ? ? ?GFR: ?Estimated Creatinine Clearance: 75 mL/min (A) (by C-G formula based on SCr of 1.6 mg/dL (H)). ? ?Liver Function Tests: ?Recent Labs  ?Lab 12/06/21 ?1845 12/07/21 ?5462 12/08/21 ?0328 12/09/21 ?7035  ?AST 899* 305* 137* 55*  ?ALT 478* 346* 224* 130*  ?ALKPHOS 122 104 113 134*  ?BILITOT 1.2 1.6* 1.0 0.7  ?PROT 6.3* 5.7* 5.7* 5.7*  ?ALBUMIN 3.2* 2.8* 2.6* 2.5*  ? ? ? ?Recent Labs  ?Lab 12/06/21 ?1845  ?LIPASE 25  ? ? ?Recent Labs  ?Lab 12/06/21 ?1846  ?AMMONIA 29  ? ? ? ?Coagulation Profile: ?Recent Labs  ?Lab 12/06/21 ?1845  ?INR 1.2  ? ? ? ? ?HbA1C: ?Recent Labs  ?  12/07/21 ?1507  ?HGBA1C 7.8*  ? ? ? ?CBG: ?Recent Labs  ?Lab  12/08/21 ?0328 12/08/21 ?1153 12/08/21 ?1636 12/08/21 ?2200 12/09/21 ?0804  ?GLUCAP 134* 159* 139* 165* 233*  ? ? ? ? ?Thyroid Function Tests: ?Recent Labs  ?  12/06/21 ?1846  ?TSH 3.800  ? ? ? ? ?Recent Results (from the past 240 hour(s))  ?Resp

## 2021-12-09 NOTE — Evaluation (Signed)
Physical Therapy Evaluation ?Patient Details ?Name: Casey Garrett ?MRN: [id number] ?DOB: Oct 11, 1965 ?Today's Date: 12/09/2021 ? ?History of Present Illness ? 56 year old African-American male with past medical history of hypertension and diabetes admitted due to sepsis/bacteremia, AKI, SBO and metabolic encephalopathy.  ?Clinical Impression ? Patient presents with mobility close to functional baseline.  Encouraged up walking with mobility tech, but no further skilled PT needs at this time.  Able to negotiate steps with rail with S only for IV.  PT will sign off.    ?   ? ?Recommendations for follow up therapy are one component of a multi-disciplinary discharge planning process, led by the attending physician.  Recommendations may be updated based on patient status, additional functional criteria and insurance authorization. ? ?Follow Up Recommendations No PT follow up ? ?  ?Assistance Recommended at Discharge PRN  ?Patient can return home with the following ? Assistance with cooking/housework ? ?  ?Equipment Recommendations None recommended by PT  ?Recommendations for Other Services ?    ?  ?Functional Status Assessment Patient has not had a recent decline in their functional status  ? ?  ?Precautions / Restrictions Precautions ?Precautions: None  ? ?  ? ?Mobility ? Bed Mobility ?Overal bed mobility: Modified Independent ?  ?  ?  ?  ?  ?  ?  ?  ? ?Transfers ?Overall transfer level: Modified independent ?  ?  ?  ?  ?  ?  ?  ?  ?  ?  ? ?Ambulation/Gait ?Ambulation/Gait assistance: Independent ?Gait Distance (Feet): 400 Feet ?Assistive device: None ?Gait Pattern/deviations: WFL(Within Functional Limits), Step-through pattern, Shuffle ?  ?  ?  ?General Gait Details: shuffle with sandles on ? ?Stairs ?Stairs: Yes ?Stairs assistance: Supervision ?Stair Management: One rail Left, Forwards ?Number of Stairs: 3 (x 2) ?General stair comments: S for safety with IV, no physical help ? ?Wheelchair Mobility ?  ? ?Modified Rankin  (Stroke Patients Only) ?  ? ?  ? ?Balance Overall balance assessment: No apparent balance deficits (not formally assessed) ?  ?  ?  ?  ?  ?  ?  ?  ?  ?  ?  ?  ?  ?  ?  ?  ?  ?  ?   ? ? ? ?Pertinent Vitals/Pain Pain Assessment ?Pain Assessment: No/denies pain  ? ? ?Home Living Family/patient expects to be discharged to:: Private residence ?Living Arrangements: Other relatives (roomate) ?  ?Type of Home: House ?Home Access: Level entry ?  ?  ?Alternate Level Stairs-Number of Steps: basement ?Home Layout: Two level;Full bath on main level;Able to live on main level with bedroom/bathroom ?Home Equipment: None ?   ?  ?Prior Function Prior Level of Function : Independent/Modified Independent ?  ?  ?  ?  ?  ?  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  ?  ? ? ?Hand Dominance  ?   ? ?  ?Extremity/Trunk Assessment  ? Upper Extremity Assessment ?Upper Extremity Assessment: Overall WFL for tasks assessed ?  ? ?Lower Extremity Assessment ?Lower Extremity Assessment: Overall WFL for tasks assessed ?  ? ?   ?Communication  ? Communication: No difficulties  ?Cognition Arousal/Alertness: Awake/alert ?Behavior During Therapy: [facility] for tasks assessed/performed ?Overall Cognitive Status: Within Functional Limits for tasks assessed ?  ?  ?  ?  ?  ?  ?  ?  ?  ?  ?  ?  ?  ?  ?  ?  ?  ?  ?  ? ?  ?  General Comments General comments (skin integrity, edema, etc.): toileted in bathroom unaided ? ?  ?Exercises    ? ?Assessment/Plan  ?  ?PT Assessment Patient does not need any further PT services  ?PT Problem List   ? ?   ?  ?PT Treatment Interventions     ? ?PT Goals (Current goals can be found in the Care Plan section)  ?Acute Rehab PT Goals ?PT Goal Formulation: All assessment and education complete, DC therapy ? ?  ?Frequency   ?  ? ? ?Co-evaluation   ?  ?  ?  ?  ? ? ?  ?AM-PAC PT "6 Clicks" Mobility  ?Outcome Measure Help needed turning from your back to your side while in a flat bed without using bedrails?: None ?Help  needed moving from lying on your back to sitting on the side of a flat bed without using bedrails?: None ?Help needed moving to and from a bed to a chair (including a wheelchair)?: None ?Help needed standing up from a chair using your arms (e.g., wheelchair or bedside chair)?: None ?Help needed to walk in hospital room?: None ?Help needed climbing 3-5 steps with a railing? : None ?6 Click Score: 24 ? ?  ?End of Session   ?Activity Tolerance: Patient tolerated treatment well ?Patient left: in bed;with call bell/phone within reach ?  ?  ?  ? ?Time: 1405-1430 ?PT Time Calculation (min) (ACUTE ONLY): 25 min ? ? ?Charges:   PT Evaluation ?$PT Eval Low Complexity: 1 Low ?PT Treatments ?$Therapeutic Activity: 8-22 mins ?  ?   ? ? ?Sheran Lawless, PT ?Acute Rehabilitation Services ?Pager:[phone] ?Office:[phone] ?12/09/2021 ? ? ?Casey Garrett ?12/09/2021, 3:47 PM ? ?

## 2021-12-09 NOTE — Progress Notes (Signed)
6N called at this time x2. No answer at main number. ?

## 2021-12-10 LAB — COMPREHENSIVE METABOLIC PANEL
ALT: 112 U/L — ABNORMAL HIGH (ref 0–44)
AST: 48 U/L — ABNORMAL HIGH (ref 15–41)
Albumin: 2.8 g/dL — ABNORMAL LOW (ref 3.5–5.0)
Alkaline Phosphatase: 215 U/L — ABNORMAL HIGH (ref 38–126)
Anion gap: 5 (ref 5–15)
BUN: 17 mg/dL (ref 6–20)
CO2: 29 mmol/L (ref 22–32)
Calcium: 8.4 mg/dL — ABNORMAL LOW (ref 8.9–10.3)
Chloride: 109 mmol/L (ref 98–111)
Creatinine, Ser: 1.4 mg/dL — ABNORMAL HIGH (ref 0.61–1.24)
GFR, Estimated: 60 mL/min — ABNORMAL LOW (ref >60)
Glucose, Bld: 150 mg/dL — ABNORMAL HIGH (ref 70–99)
Potassium: 3.8 mmol/L (ref 3.5–5.1)
Sodium: 143 mmol/L (ref 135–145)
Total Bilirubin: 0.9 mg/dL (ref 0.3–1.2)
Total Protein: 6.1 g/dL — ABNORMAL LOW (ref 6.5–8.1)

## 2021-12-10 LAB — CBC
HCT: 43.3 % (ref 39.0–52.0)
Hemoglobin: 14.5 g/dL (ref 13.0–17.0)
MCH: 29.1 pg (ref 26.0–34.0)
MCHC: 33.5 g/dL (ref 30.0–36.0)
MCV: 86.9 fL (ref 80.0–100.0)
Platelets: 111 10*3/uL — ABNORMAL LOW (ref 150–400)
RBC: 4.98 MIL/uL (ref 4.22–5.81)
RDW: 13.6 % (ref 11.5–15.5)
WBC: 14.8 10*3/uL — ABNORMAL HIGH (ref 4.0–10.5)
nRBC: 0 % (ref 0.0–0.2)

## 2021-12-10 LAB — GLUCOSE, CAPILLARY: Glucose-Capillary: 160 mg/dL — ABNORMAL HIGH (ref 70–99)

## 2021-12-10 MED ORDER — HYDRALAZINE HCL 25 MG PO TABS
25.0000 mg | ORAL_TABLET | Freq: Three times a day (TID) | ORAL | Status: DC
  Administered 2021-12-10: 25 mg via ORAL
  Filled 2021-12-10: qty 1

## 2021-12-10 MED ORDER — AMLODIPINE BESYLATE 10 MG PO TABS: 10.0000 mg | ORAL_TABLET | Freq: Every day | ORAL | 2 refills | Status: AC

## 2021-12-10 MED ORDER — HYDRALAZINE HCL 25 MG PO TABS: 25.0000 mg | ORAL_TABLET | Freq: Three times a day (TID) | ORAL | 0 refills | Status: DC

## 2021-12-10 MED ORDER — METFORMIN HCL ER 500 MG PO TB24
500.0000 mg | ORAL_TABLET | Freq: Every day | ORAL | 2 refills | Status: AC
Start: 2021-12-10 — End: ?

## 2021-12-10 MED ORDER — PHENAZOPYRIDINE HCL 100 MG PO TABS
100.0000 mg | ORAL_TABLET | Freq: Three times a day (TID) | ORAL | 0 refills | Status: AC
Start: 2021-12-10 — End: 2021-12-15

## 2021-12-10 MED ORDER — CEPHALEXIN 500 MG PO CAPS: 500.0000 mg | ORAL_CAPSULE | Freq: Three times a day (TID) | ORAL | 0 refills | Status: AC

## 2021-12-10 MED ORDER — ACETAMINOPHEN 325 MG PO TABS: 650.0000 mg | ORAL_TABLET | Freq: Four times a day (QID) | ORAL | Status: AC | PRN

## 2021-12-10 MED ORDER — BLOOD GLUCOSE MONITOR KIT: PACK | 0 refills | Status: AC

## 2021-12-10 MED ORDER — PHENAZOPYRIDINE HCL 100 MG PO TABS
100.0000 mg | ORAL_TABLET | Freq: Three times a day (TID) | ORAL | Status: DC
  Administered 2021-12-10: 100 mg via ORAL
  Filled 2021-12-10 (×3): qty 1

## 2021-12-10 MED ORDER — AMLODIPINE BESYLATE 10 MG PO TABS
10.0000 mg | ORAL_TABLET | Freq: Every day | ORAL | Status: DC
  Administered 2021-12-10: 10 mg via ORAL
  Filled 2021-12-10: qty 1

## 2021-12-10 MED ORDER — DOCUSATE SODIUM 100 MG PO CAPS: 100.0000 mg | ORAL_CAPSULE | Freq: Two times a day (BID) | ORAL | 0 refills | Status: DC | PRN

## 2021-12-10 NOTE — Plan of Care (Signed)
  Problem: Nutrition: Goal: Adequate nutrition will be maintained Outcome: Progressing   Problem: Pain Managment: Goal: General experience of comfort will improve Outcome: Progressing   Problem: Safety: Goal: Ability to remain free from injury will improve Outcome: Progressing   

## 2021-12-10 NOTE — Discharge Summary (Signed)
?[facility] ? ?Physician Discharge Summary  ? ?Patient ID: ?Casey Garrett ?MRN: [id number] ?DOB/AGE: 02-03-1966 56 y.o. ? ?Admit date: 12/06/2021 ?Discharge date:   12/10/2021 ? ? ?PCP: Pcp, No ? ?DISCHARGE DIAGNOSES:  ?Principal Problem: ?  Severe sepsis ([place]) ?Active Problems: ?  Lactic acidosis ?  Bacteremia due to Klebsiella pneumoniae ?  AKI (acute kidney injury) ([place]) ?  Small bowel obstruction ([place]) ?  Acute metabolic encephalopathy ?  Abnormal LFTs ?  Essential hypertension ?  Diabetes mellitus type 2 in obese [facility]) ?  Hypokalemia ?  Polysubstance abuse ([place]) ?  Obesity, Class III, BMI 40-49.9 (morbid obesity) ([place]) ? ? ?RECOMMENDATIONS FOR OUTPATIENT FOLLOW UP: ?Outpatient follow-up with PCP for further management of hypertension ?Patient will need blood work to check electrolytes and liver function tests and renal function in 1 week ? ? ?Home Health: None ?Equipment/Devices: None ? ?CODE STATUS: Full code ? ?DISCHARGE CONDITION: fair ? ?Diet recommendation: Modified carbohydrate ? ?INITIAL HISTORY: ?56 year old African-American male with past medical history of hypertension and diabetes who was brought into the emergency department due to confusion and partial responsiveness.  Evaluation revealed that patient was febrile.  Concern for sepsis was identified.  Noted to have abnormal UA.  Subsequently noted to have bacteremia.  CT scan of the chest abdomen pelvis raised concern for small bowel obstruction.  LFTs were abnormal.  Patient was admitted to the intensive care unit.  Started on broad-spectrum antibiotics.  Stabilized and then transferred to hospitalist service.   ? ?Consultants: Nephrology.  General surgery: Signed off. ?  ?Procedures: None yet ? ? ?HOSPITAL COURSE:  ? ?* Severe sepsis ([place]) ?Noted to have Klebsiella bacteremia.  Abdominal source or UTI suspected.  Urine culture without any growth.  Initially started on broad-spectrum antibiotics.  Narrowed to ceftriaxone on 4/12.   ?Klebsiella sensitivities were reviewed.  Patient was changed over to Keflex.  Pyridium for dysuria. ? ?Bacteremia due to Klebsiella pneumoniae ?See under severe sepsis. ? ?Lactic acidosis ?Secondary to sepsis.  Was noted to be downtrending. ? ?AKI (acute kidney injury) ([place]) ?Thought to be secondary to prerenal azotemia in the setting of hydrochlorothiazide use prior to admission. ?Presented with creatinine of 4.4.  Went up to 4.97.  Seen by nephrology.  IV fluids administered.  Creatinine improving and almost back to baseline. ? ?Small bowel obstruction ([place]) ?Appears to have resolved.  Patient was seen by general surgery. ? ?Acute metabolic encephalopathy ?Likely secondary to sepsis.  Now resolved. ? ?Diabetes mellitus type 2 in obese [facility]) ?HbA1c 7.8.  Continue metformin at discharge.  Follow-up with PCP. ? ?Essential hypertension ?HCTZ to be held.  Will be discharged on amlodipine and hydralazine. ? ?Abnormal LFTs ?Most likely secondary to sepsis.  LFTs are improving.  Abdomen is benign.  CT of the abdomen pelvis does not show any abnormalities in the hepatobiliary system. ?HIV nonreactive. ?Hepatitis panel is unremarkable ? ?Hypokalemia ?Potassium being repleted.  Magnesium 2.1. ? ?Polysubstance abuse ([place]) ?Urine drug screen was positive for amphetamines and THC.  Complained of headache.  CT head at the time of admission was unremarkable.  No neurological deficits ? ?Obesity, Class III, BMI 40-49.9 (morbid obesity) ([place]) ?Estimated body mass index is 40.22 kg/m? as calculated from the following: ?  Height as of this encounter: 6' (1.829 m). ?  Weight as of this encounter: 134.5 kg. ? ? ? ?Patient is stable.  Okay for discharge home today. ? ? ?PERTINENT LABS: ? ?The results of significant diagnostics from this hospitalization (  including imaging, microbiology, ancillary and laboratory) are listed below for reference.   ? ?Microbiology: ?Recent Results (from the past 240 hour(s))  ?Resp Panel by RT-PCR (Flu  A&B, Covid) Nasopharyngeal Swab     Status: None  ? Collection Time: 12/06/21  6:45 PM  ? Specimen: Nasopharyngeal Swab; Nasopharyngeal(NP) swabs in vial transport medium  ?Result Value Ref Range Status  ? SARS Coronavirus 2 by RT PCR NEGATIVE NEGATIVE Final  ?  Comment: (NOTE) ?SARS-CoV-2 target nucleic acids are NOT DETECTED. ? ?The SARS-CoV-2 RNA is generally detectable in upper respiratory ?specimens during the acute phase of infection. The lowest ?concentration of SARS-CoV-2 viral copies this assay can detect is ?138 copies/mL. A negative result does not preclude SARS-Cov-2 ?infection and should not be used as the sole basis for treatment or ?other patient management decisions. A negative result may occur with  ?improper specimen collection/handling, submission of specimen other ?than nasopharyngeal swab, presence of viral mutation(s) within the ?areas targeted by this assay, and inadequate number of viral ?copies(<138 copies/mL). A negative result must be combined with ?clinical observations, patient history, and epidemiological ?information. The expected result is Negative. ? ?Fact Sheet for Patients:  ?EntrepreneurPulse.com.au ? ?Fact Sheet for Healthcare Providers:  ?IncredibleEmployment.be ? ?This test is no t yet approved or cleared by the [place] FDA and  ?has been authorized for detection and/or diagnosis of SARS-CoV-2 by ?FDA under an Emergency Use Authorization (EUA). This EUA will remain  ?in effect (meaning this test can be used) for the duration of the ?COVID-19 declaration under Section 564(b)(1) of the Act, 21 ?U.S.C.section 360bbb-3(b)(1), unless the authorization is terminated  ?or revoked sooner.  ? ? ?  ? Influenza A by PCR NEGATIVE NEGATIVE Final  ? Influenza B by PCR NEGATIVE NEGATIVE Final  ?  Comment: (NOTE) ?The Xpert Xpress SARS-CoV-2/FLU/RSV plus assay is intended as an aid ?in the diagnosis of influenza from Nasopharyngeal swab specimens  and ?should not be used as a sole basis for treatment. Nasal washings and ?aspirates are unacceptable for Xpert Xpress SARS-CoV-2/FLU/RSV ?testing. ? ?Fact Sheet for Patients: ?EntrepreneurPulse.com.au ? ?Fact Sheet for Healthcare Providers: ?IncredibleEmployment.be ? ?This test is not yet approved or cleared by the [place] FDA and ?has been authorized for detection and/or diagnosis of SARS-CoV-2 by ?FDA under an Emergency Use Authorization (EUA). This EUA will remain ?in effect (meaning this test can be used) for the duration of the ?COVID-19 declaration under Section 564(b)(1) of the Act, 21 U.S.C. ?section 360bbb-3(b)(1), unless the authorization is terminated or ?revoked. ? ?Performed at [facility], [place] [address], [place], [place] ?[address] ?  ?Blood Culture (routine x 2)     Status: Abnormal  ? Collection Time: 12/06/21  6:45 PM  ? Specimen: BLOOD  ?Result Value Ref Range Status  ? Specimen Description BLOOD RIGHT ANTECUBITAL  Final  ? Special Requests   Final  ?  BOTTLES DRAWN AEROBIC AND ANAEROBIC Blood Culture adequate volume  ? Culture  Setup Time   Final  ?  GRAM NEGATIVE RODS ?IN BOTH AEROBIC AND ANAEROBIC BOTTLES ?CRITICAL RESULT CALLED TO, READ BACK BY AND VERIFIED WITH: ?PHARMD JAMES LEDFORD 12/07/21@6 :49 BY TW ?Performed at [facility], [place] [address] [place], [place] [address] ?  ? Culture KLEBSIELLA PNEUMONIAE (A)  Final  ? Report Status 12/09/2021 FINAL  Final  ? Organism ID, Bacteria KLEBSIELLA PNEUMONIAE  Final  ?    Susceptibility  ? Klebsiella pneumoniae - MIC*  ?  AMPICILLIN RESISTANT  Resistant   ?  CEFAZOLIN <=4 SENSITIVE Sensitive   ?  CEFEPIME <=0.12 SENSITIVE Sensitive   ?  CEFTAZIDIME <=1 SENSITIVE Sensitive   ?  CEFTRIAXONE <=0.25 SENSITIVE Sensitive   ?  CIPROFLOXACIN <=0.25 SENSITIVE Sensitive   ?  GENTAMICIN <=1 SENSITIVE Sensitive   ?  IMIPENEM 0.5 SENSITIVE Sensitive   ?  TRIMETH/SULFA <=20 SENSITIVE Sensitive   ?   AMPICILLIN/SULBACTAM <=2 SENSITIVE Sensitive   ?  PIP/TAZO <=4 SENSITIVE Sensitive   ?  * KLEBSIELLA PNEUMONIAE  ?Blood Culture ID Panel (Reflexed)     Status: Abnormal  ? Collection Time: 12/06/21  6:45 PM  ?Result Value Ref Ra

## 2021-12-10 NOTE — TOC Transition Note (Signed)
Transition of Care (TOC) - CM/SW Discharge Note ? ? ?Patient Details  ?Name: Casey Garrett ?MRN: [id number] ?Date of Birth: 1965/11/23 ? ?Transition of Care (TOC) CM/SW Contact:  ?Carles Collet, RN ?Phone Number: ?12/10/2021, 12:20 PM ? ? ?Clinical Narrative:    ?Spoke w patient over the phone. ?Discussed Reli On brand glucose monitor and supplies at [facility]. Patient will purchase these when he gets his medications. ?Explained [place] program, patient understands to give [place] letter to pharmacist at [facility] for 30 day supply of medication. ?[place] letter and [facility] list tubed to 6N tube station A. Nurse and tech informed by secure to provide letter to patient.  ?Patient understands to call Monday to schedule follow up with [facility] clinic of choice ? ? ? ?Final next level of care: Home/Self Care ?Barriers to Discharge: No Barriers Identified ? ? ?Patient Goals and CMS Choice ?  ?  ?  ? ?Discharge Placement ?  ?           ?  ?  ?  ?  ? ?Discharge Plan and Services ?  ?  ?           ?  ?  ?  ?  ?  ?  ?  ?  ?  ?  ? ?Social Determinants of Health (SDOH) Interventions ?  ? ? ?Readmission Risk Interventions ?   ? View : No data to display.  ?  ?  ?  ? ? ? ? ? ?

## 2021-12-13 ENCOUNTER — Emergency Department (HOSPITAL_COMMUNITY)
Admission: EM | Admit: 2021-12-13 | Discharge: 2021-12-13 | Disposition: A | Discharge status: Home / Self Care | Payer: Medicaid Other | Attending: Emergency Medicine | Admitting: Emergency Medicine

## 2021-12-13 ENCOUNTER — Other Ambulatory Visit: Payer: Self-pay

## 2021-12-13 ENCOUNTER — Encounter (HOSPITAL_COMMUNITY): Payer: Self-pay | Admitting: Emergency Medicine

## 2021-12-13 DIAGNOSIS — Z79899 Other long term (current) drug therapy: Secondary | ICD-10-CM | POA: Insufficient documentation

## 2021-12-13 DIAGNOSIS — Z7984 Long term (current) use of oral hypoglycemic drugs: Secondary | ICD-10-CM | POA: Insufficient documentation

## 2021-12-13 DIAGNOSIS — N390 Urinary tract infection, site not specified: Secondary | ICD-10-CM | POA: Insufficient documentation

## 2021-12-13 DIAGNOSIS — M542 Cervicalgia: Secondary | ICD-10-CM | POA: Insufficient documentation

## 2021-12-13 DIAGNOSIS — E1149 Type 2 diabetes mellitus with other diabetic neurological complication: Secondary | ICD-10-CM | POA: Insufficient documentation

## 2021-12-13 DIAGNOSIS — R519 Headache, unspecified: Secondary | ICD-10-CM | POA: Insufficient documentation

## 2021-12-13 DIAGNOSIS — R202 Paresthesia of skin: Secondary | ICD-10-CM | POA: Insufficient documentation

## 2021-12-13 DIAGNOSIS — I10 Essential (primary) hypertension: Secondary | ICD-10-CM | POA: Insufficient documentation

## 2021-12-13 LAB — CBC
HCT: 47.2 % (ref 39.0–52.0)
Hemoglobin: 15.1 g/dL (ref 13.0–17.0)
MCH: 28.5 pg (ref 26.0–34.0)
MCHC: 32 g/dL (ref 30.0–36.0)
MCV: 89.2 fL (ref 80.0–100.0)
Platelets: 311 10*3/uL (ref 150–400)
RBC: 5.29 MIL/uL (ref 4.22–5.81)
RDW: 13.7 % (ref 11.5–15.5)
WBC: 10.4 10*3/uL (ref 4.0–10.5)
nRBC: 0 % (ref 0.0–0.2)

## 2021-12-13 LAB — URINALYSIS, ROUTINE W REFLEX MICROSCOPIC
Bacteria, UA: NONE SEEN
Bilirubin Urine: NEGATIVE
Glucose, UA: >=500 mg/dL — AB
Hgb urine dipstick: NEGATIVE
Ketones, ur: NEGATIVE mg/dL
Leukocytes,Ua: NEGATIVE
Nitrite: POSITIVE — AB
Protein, ur: 100 mg/dL — AB
Specific Gravity, Urine: 1.011 (ref 1.005–1.030)
pH: 5 (ref 5.0–8.0)

## 2021-12-13 LAB — COMPREHENSIVE METABOLIC PANEL
ALT: 56 U/L — ABNORMAL HIGH (ref 0–44)
AST: 38 U/L (ref 15–41)
Albumin: 3.2 g/dL — ABNORMAL LOW (ref 3.5–5.0)
Alkaline Phosphatase: 299 U/L — ABNORMAL HIGH (ref 38–126)
Anion gap: 9 (ref 5–15)
BUN: 11 mg/dL (ref 6–20)
CO2: 27 mmol/L (ref 22–32)
Calcium: 8.7 mg/dL — ABNORMAL LOW (ref 8.9–10.3)
Chloride: 104 mmol/L (ref 98–111)
Creatinine, Ser: 1.24 mg/dL (ref 0.61–1.24)
GFR, Estimated: >60 mL/min (ref >60)
Glucose, Bld: 239 mg/dL — ABNORMAL HIGH (ref 70–99)
Potassium: 4.1 mmol/L (ref 3.5–5.1)
Sodium: 140 mmol/L (ref 135–145)
Total Bilirubin: 0.5 mg/dL (ref 0.3–1.2)
Total Protein: 7.1 g/dL (ref 6.5–8.1)

## 2021-12-13 LAB — MAGNESIUM: Magnesium: 2 mg/dL (ref 1.7–2.4)

## 2021-12-13 LAB — CBG MONITORING, ED: Glucose-Capillary: 230 mg/dL — ABNORMAL HIGH (ref 70–99)

## 2021-12-13 MED ORDER — SODIUM CHLORIDE 0.9 % IV BOLUS
1000.0000 mL | Freq: Once | INTRAVENOUS | Status: AC
Start: 2021-12-13 — End: 2021-12-13
  Administered 2021-12-13: 1000 mL via INTRAVENOUS

## 2021-12-13 MED ORDER — GABAPENTIN 100 MG PO CAPS: 100.0000 mg | ORAL_CAPSULE | Freq: Three times a day (TID) | ORAL | 0 refills | Status: AC

## 2021-12-13 MED ORDER — DIPHENHYDRAMINE HCL 50 MG/ML IJ SOLN
12.5000 mg | Freq: Once | INTRAMUSCULAR | Status: AC
  Administered 2021-12-13: 12.5 mg via INTRAVENOUS
  Filled 2021-12-13: qty 1

## 2021-12-13 MED ORDER — HYDRALAZINE HCL 25 MG PO TABS
25.0000 mg | ORAL_TABLET | Freq: Once | ORAL | Status: AC
  Administered 2021-12-13: 25 mg via ORAL
  Filled 2021-12-13: qty 1

## 2021-12-13 MED ORDER — METOCLOPRAMIDE HCL 5 MG/ML IJ SOLN
10.0000 mg | Freq: Once | INTRAMUSCULAR | Status: AC
  Administered 2021-12-13: 10 mg via INTRAVENOUS
  Filled 2021-12-13: qty 2

## 2021-12-13 MED ORDER — KETOROLAC TROMETHAMINE 15 MG/ML IJ SOLN
15.0000 mg | Freq: Once | INTRAMUSCULAR | Status: AC
  Administered 2021-12-13: 15 mg via INTRAVENOUS
  Filled 2021-12-13: qty 1

## 2021-12-13 NOTE — ED Triage Notes (Addendum)
Pt. Stated, I was here for a week cause I passed out cause I was septic. Today my feet are feeling numb, Im having to pee like every 10 min. And when I do , I have to poop a little and I know Im dehydrated.  ?Im suppose to go back to work tomorrow and theres no way I can go back tomorrow cause I cant even sleep. ?

## 2021-12-13 NOTE — ED Notes (Signed)
Patient verbalizes understanding of discharge instructions. Opportunity for questioning and answers were provided. Armband removed by staff, pt discharged from ED.  

## 2021-12-13 NOTE — Discharge Instructions (Addendum)
Please follow-up with the health clinic attached to these discharge papers for a provider who can follow your diabetes.  Continue to take your antibiotic for UTI.  Also, gabapentin has been sent to your pharmacy.   ? ?This is a 3 times daily medication that you can use for neuropathy. ?

## 2021-12-13 NOTE — ED Provider Notes (Signed)
?[facility] ?Provider Note ? ? ?CSN: [id number] ?Arrival date & time: 12/13/21  1149 ? ?  ? ?History ? ?Chief Complaint  ?Patient presents with  ? Dysuria  ? Urinary Frequency  ? Neck Pain  ? feet numb  ? ? ?Casey Garrett is a 56 y.o. male with a past medical history of hypertension and type 2 diabetes presenting today with concern for urinary frequency, migraines and numbness in his bilateral feet.  Reports that he was in the hospital and recently discharged on 4/15 after a 4-day stay.  At that time he was septic secondary to UTI.  He also had a bowel obstruction.  He says that he continues to have urinary frequency but denies dysuria or hematuria.  Says that he is going to the restroom every 20 minutes and every time he pees he feels as if he needs to have a bowel movement.  Reports resolution in his small bowel obstruction and that he is having full bowel movements at home.   ? ?Says that for the past weeks he has also experienced numbness and tingling in the soles of his feet.  He is concerned this may be an infection but says that the inpatient team during his hospitalization were not concerned.  No fevers, chills, dizziness, chest pain or shortness of breath. ? ?Dysuria ?Presenting symptoms: dysuria   ?Associated symptoms: urinary frequency   ?Associated symptoms: no fever   ?Urinary Frequency ?Associated symptoms include headaches.  ?Neck Pain ?Associated symptoms: headaches   ?Associated symptoms: no fever   ? ?  ? ?Home Medications ?Prior to Admission medications   ?Medication Sig Start Date End Date Taking? Authorizing Provider  ?acetaminophen (TYLENOL) 325 MG tablet Take 2 tablets (650 mg total) by mouth every 6 (six) hours as needed for headache. 12/10/21   Bonnielee Haff, MD  ?amLODipine (NORVASC) 10 MG tablet Take 1 tablet (10 mg total) by mouth daily. 12/10/21   Bonnielee Haff, MD  ?blood glucose meter kit and supplies KIT Dispense based on patient and  insurance preference. Use up to four times daily as directed. 12/10/21   Bonnielee Haff, MD  ?cephALEXin (KEFLEX) 500 MG capsule Take 1 capsule (500 mg total) by mouth every 8 (eight) hours for 5 days. 12/10/21 12/15/21  Bonnielee Haff, MD  ?docusate sodium (COLACE) 100 MG capsule Take 1 capsule (100 mg total) by mouth 2 (two) times daily as needed for mild constipation. 12/10/21   Bonnielee Haff, MD  ?hydrALAZINE (APRESOLINE) 25 MG tablet Take 1 tablet (25 mg total) by mouth every 8 (eight) hours. 12/10/21   Bonnielee Haff, MD  ?metFORMIN (GLUCOPHAGE-XR) 500 MG 24 hr tablet Take 1 tablet (500 mg total) by mouth daily with breakfast. 12/10/21   Bonnielee Haff, MD  ?phenazopyridine (PYRIDIUM) 100 MG tablet Take 1 tablet (100 mg total) by mouth 3 (three) times daily with meals for 5 days. 12/10/21 12/15/21  Bonnielee Haff, MD  ?losartan (COZAAR) 100 MG tablet Take 1 tablet (100 mg total) by mouth daily. 10/08/18 01/20/21  Fatima Blank, MD  ?   ? ?Allergies    ?Patient has no known allergies.   ? ?Review of Systems   ?Review of Systems  ?Constitutional:  Negative for chills and fever.  ?Genitourinary:  Positive for dysuria and frequency.  ?Musculoskeletal:  Positive for neck pain.  ?Neurological:  Positive for headaches.  ? ?Physical Exam ?Updated Vital Signs ?BP (!) 167/100   Pulse (!) 102   Temp 98.4 ?  F (36.9 ?C) (Oral)   Resp 18   SpO2 98%  ?Physical Exam ?Vitals and nursing note reviewed.  ?Constitutional:   ?   General: He is not in acute distress. ?   Appearance: Normal appearance. He is not ill-appearing.  ?HENT:  ?   Head: Normocephalic and atraumatic.  ?Eyes:  ?   General: No scleral icterus. ?   Extraocular Movements: Extraocular movements intact.  ?   Conjunctiva/sclera: Conjunctivae normal.  ?   Pupils: Pupils are equal, round, and reactive to light.  ?Cardiovascular:  ?   Rate and Rhythm: Normal rate and regular rhythm.  ?Pulmonary:  ?   Effort: Pulmonary effort is normal. No respiratory  distress.  ?Abdominal:  ?   General: Abdomen is flat.  ?   Palpations: Abdomen is soft.  ?Musculoskeletal:  ?   Cervical back: Normal range of motion.  ?Skin: ?   General: Skin is warm and dry.  ?   Findings: No rash.  ?   Comments: Bilateral feet with dry skin.  No wounds.  ?Neurological:  ?   Mental Status: He is alert and oriented to person, place, and time.  ?   Sensory: No sensory deficit (Distal sensation intact.).  ?   Motor: No weakness.  ?   Gait: Gait normal.  ?   Comments: Cranial nerves II through XII grossly intact  ?Psychiatric:     ?   Mood and Affect: Mood normal.     ?   Behavior: Behavior normal.  ? ? ?ED Results / Procedures / Treatments   ?Labs ?(all labs ordered are listed, but only abnormal results are displayed) ?Labs Reviewed  ?URINALYSIS, ROUTINE W REFLEX MICROSCOPIC - Abnormal; Notable for the following components:  ?    Result Value  ? Color, Urine AMBER (*)   ? Glucose, UA >=500 (*)   ? Protein, ur 100 (*)   ? Nitrite POSITIVE (*)   ? All other components within normal limits  ?COMPREHENSIVE METABOLIC PANEL - Abnormal; Notable for the following components:  ? Glucose, Bld 239 (*)   ? Calcium 8.7 (*)   ? Albumin 3.2 (*)   ? ALT 56 (*)   ? Alkaline Phosphatase 299 (*)   ? All other components within normal limits  ?CBG MONITORING, ED - Abnormal; Notable for the following components:  ? Glucose-Capillary 230 (*)   ? All other components within normal limits  ?URINE CULTURE  ?CBC  ?MAGNESIUM  ? ? ?EKG ?None ? ?Radiology ?No results found. ? ?Procedures ?Procedures  ? ?Medications Ordered in ED ?Medications - No data to display ? ?ED Course/ Medical Decision Making/ A&P ?  ?                        ?Medical Decision Making ?Amount and/or Complexity of Data Reviewed ?Labs: ordered. ? ?Risk ?Prescription drug management. ? ? ?This patient presents to the ED for concern of multiple complaints.  Patient reports that he was in the hospital after being septic and continues to have urinary frequency.   No other urinary symptoms.  Also says he is worried he might have an infection in his feet because they bother him sometimes.  Also states he has a headache. ?  ?  ?Past Medical History / Co-morbidities / Social History: ?Hypertension, diabetes, recently diagnosed sepsis secondary to urinary tract infection ?  ?Additional history: ?Additional history obtained from chart review.  Patient's blood cultures grew out Klebsiella  during his hospital stay.  He was discharged on Keflex for UTI that he reports that he is taking. ? ?He was diagnosed with hypertension and diabetes in the past but was not medicated.  He was started on amlodipine, hydralazine and metformin upon discharge from the hospital ?  ?Physical Exam: ?Physical exam performed. The pertinent findings include:  ?-Feet with dry skin, neurovascularly intact ?-Head to toe neurologic exam without findings ? ? ?Lab Tests: ?I ordered, and personally interpreted labs.  The pertinent results include:  ?ALT 56, down from 112 during his hospitalization ?Alk phos 299, slightly higher than hospitalization ?Albumin 3.2, higher than 2.8 during hospitalization ?Kidney function normal ?Urinalysis improved from last visit however with glucosuria ?  ?  ?Cardiac Monitoring:  ?The patient was maintained on a cardiac monitor.  I viewed this and patient remained in normal sinus rhythm with a normal rate throughout his stay ?  ?Medications: ?I ordered medication including hydralazine, Toradol, Benadryl and Reglan for patient's headache. Reevaluation of the patient after these medicines showed that the patient resolved. I have reviewed the patients home medicines and have made adjustments as needed. ? ?Disposition: ?After consideration of the diagnostic results and the patients response to treatment, I feel that the patient is stable for discharge home.  His labs are either at baseline or improved from his discharge 4 days ago. ? ?He had multiple complaints and says that his pain  has resolved.  He continues to be hypertensive but denies any chest pain, headache, visual disturbance.  States that he is going to get with a primary care provider to address his hypertension.  He is not established wit

## 2021-12-14 LAB — URINE CULTURE: Culture: NO GROWTH

## 2022-08-17 DIAGNOSIS — E1142 Type 2 diabetes mellitus with diabetic polyneuropathy: Secondary | ICD-10-CM | POA: Diagnosis not present

## 2022-08-17 DIAGNOSIS — M79672 Pain in left foot: Secondary | ICD-10-CM | POA: Diagnosis not present

## 2022-08-17 DIAGNOSIS — M26621 Arthralgia of right temporomandibular joint: Secondary | ICD-10-CM | POA: Diagnosis not present

## 2022-08-29 DIAGNOSIS — I4510 Unspecified right bundle-branch block: Secondary | ICD-10-CM | POA: Diagnosis not present

## 2022-08-29 DIAGNOSIS — M546 Pain in thoracic spine: Secondary | ICD-10-CM | POA: Diagnosis not present

## 2022-08-29 DIAGNOSIS — I440 Atrioventricular block, first degree: Secondary | ICD-10-CM | POA: Diagnosis not present

## 2022-08-29 DIAGNOSIS — M26621 Arthralgia of right temporomandibular joint: Secondary | ICD-10-CM | POA: Diagnosis not present

## 2022-08-29 DIAGNOSIS — N529 Male erectile dysfunction, unspecified: Secondary | ICD-10-CM | POA: Diagnosis not present

## 2022-08-29 DIAGNOSIS — R9431 Abnormal electrocardiogram [ECG] [EKG]: Secondary | ICD-10-CM | POA: Diagnosis not present

## 2022-08-29 DIAGNOSIS — R079 Chest pain, unspecified: Secondary | ICD-10-CM | POA: Diagnosis not present

## 2022-08-29 DIAGNOSIS — R059 Cough, unspecified: Secondary | ICD-10-CM | POA: Diagnosis not present

## 2022-08-29 DIAGNOSIS — K5900 Constipation, unspecified: Secondary | ICD-10-CM | POA: Diagnosis not present

## 2022-08-29 DIAGNOSIS — R051 Acute cough: Secondary | ICD-10-CM | POA: Diagnosis not present

## 2022-09-06 ENCOUNTER — Encounter: Payer: Self-pay | Admitting: Cardiology

## 2022-09-06 ENCOUNTER — Ambulatory Visit: Payer: Medicaid Other | Attending: Cardiology | Admitting: Cardiology

## 2022-09-06 VITALS — BP 140/98 | HR 102 | Ht 72.0 in | Wt 245.6 lb

## 2022-09-06 DIAGNOSIS — I10 Essential (primary) hypertension: Secondary | ICD-10-CM

## 2022-09-06 DIAGNOSIS — R079 Chest pain, unspecified: Secondary | ICD-10-CM

## 2022-09-06 DIAGNOSIS — Z01812 Encounter for preprocedural laboratory examination: Secondary | ICD-10-CM | POA: Diagnosis not present

## 2022-09-06 DIAGNOSIS — E1100 Type 2 diabetes mellitus with hyperosmolarity without nonketotic hyperglycemic-hyperosmolar coma (NKHHC): Secondary | ICD-10-CM | POA: Diagnosis not present

## 2022-09-06 DIAGNOSIS — E782 Mixed hyperlipidemia: Secondary | ICD-10-CM

## 2022-09-06 MED ORDER — METOPROLOL TARTRATE 100 MG PO TABS: ORAL_TABLET | ORAL | 0 refills | Status: DC

## 2022-09-06 MED ORDER — HYDROCHLOROTHIAZIDE 12.5 MG PO CAPS: 12.5000 mg | ORAL_CAPSULE | Freq: Every day | ORAL | 3 refills | Status: DC

## 2022-09-06 NOTE — Progress Notes (Signed)
Cardiology Office Note:    Date:  09/06/2022   ID:  Casey Garrett, DOB 08/25/1966, MRN [id number]  PCP:  Pcp, No  Cardiologist:  Berniece Salines, DO  Electrophysiologist:  None   Referring MD: Heywood Bene, *   " I have had some chest pain recently"   History of Present Illness:    Casey Garrett is a 57 y.o. male with a hx of hypertension, depression, anxiety, diabetes mellitus, obesity here to be evaluated for chest discomfort.  The patient tells me over the last several weeks he has had intermittent left-sided chest tightness.  Comes and goes.  Nothing makes it better or worse.  He is concerned about this therefore he was sent by his PCP to be evaluated by cardiology.  He is not experiencing chest pain in the office today.  No other complaints at this time.   Past Medical History:  Diagnosis Date   Anxiety    Depression    Diabetes mellitus ([place])    Hypertension    Migraines     History reviewed. No pertinent surgical history.  Current Medications: Current Meds  Medication Sig   acetaminophen (TYLENOL) 325 MG tablet Take 2 tablets (650 mg total) by mouth every 6 (six) hours as needed for headache.   amLODipine (NORVASC) 10 MG tablet Take 1 tablet (10 mg total) by mouth daily.   blood glucose meter kit and supplies KIT Dispense based on patient and insurance preference. Use up to four times daily as directed.   cyclobenzaprine (FLEXERIL) 10 MG tablet Take by mouth.   gabapentin (NEURONTIN) 100 MG capsule Take 1 capsule (100 mg total) by mouth 3 (three) times daily.   hydrochlorothiazide (MICROZIDE) 12.5 MG capsule Take 1 capsule (12.5 mg total) by mouth daily.   metFORMIN (GLUCOPHAGE-XR) 500 MG 24 hr tablet Take 1 tablet (500 mg total) by mouth daily with breakfast.   metoprolol tartrate (LOPRESSOR) 100 MG tablet Take 2 hours prior to CT   rosuvastatin (CRESTOR) 20 MG tablet Take 1 tablet by mouth daily.   Vitamin D, Ergocalciferol, (DRISDOL) 1.25 MG (50000  UNIT) CAPS capsule Take 50,000 Units by mouth once a week.     Allergies:   Patient has no known allergies.   Social History   Socioeconomic History   Marital status: Single    Spouse name: Not on file   Number of children: Not on file   Years of education: Not on file   Highest education level: Not on file  Occupational History   Not on file  Tobacco Use   Smoking status: Former   Smokeless tobacco: Never  Vaping Use   Vaping Use: Never used  Substance and Sexual Activity   Alcohol use: Yes    Comment: "sometimes"   Drug use: Yes    Types: Marijuana   Sexual activity: Not on file  Other Topics Concern   Not on file  Social History Narrative   Not on file   Social Determinants of Health   Financial Resource Strain: Not on file  Food Insecurity: Not on file  Transportation Needs: Not on file  Physical Activity: Not on file  Stress: Not on file  Social Connections: Not on file     Family History: The patient's family history includes Hypertension in his mother.  ROS:   Review of Systems  Constitution: Negative for decreased appetite, fever and weight gain.  HENT: Negative for congestion, ear discharge, hoarse voice and sore throat.  Eyes: Negative for discharge, redness, vision loss in right eye and visual halos.  Cardiovascular: Negative for chest pain, dyspnea on exertion, leg swelling, orthopnea and palpitations.  Respiratory: Negative for cough, hemoptysis, shortness of breath and snoring.   Endocrine: Negative for heat intolerance and polyphagia.  Hematologic/Lymphatic: Negative for bleeding problem. Does not bruise/bleed easily.  Skin: Negative for flushing, nail changes, rash and suspicious lesions.  Musculoskeletal: Negative for arthritis, joint pain, muscle cramps, myalgias, neck pain and stiffness.  Gastrointestinal: Negative for abdominal pain, bowel incontinence, diarrhea and excessive appetite.  Genitourinary: Negative for decreased libido, genital  sores and incomplete emptying.  Neurological: Negative for brief paralysis, focal weakness, headaches and loss of balance.  Psychiatric/Behavioral: Negative for altered mental status, depression and suicidal ideas.  Allergic/Immunologic: Negative for HIV exposure and persistent infections.    EKGs/Labs/Other Studies Reviewed:    The following studies were reviewed today:   EKG:  The ekg ordered today demonstrates sinus tachycardia HR 102 bpm with right bundle branch block.  Recent Labs: 12/06/2021: TSH 3.800 12/13/2021: ALT 56; BUN 11; Creatinine, Ser 1.24; Hemoglobin 15.1; Magnesium 2.0; Platelets 311; Potassium 4.1; Sodium 140  Recent Lipid Panel No results found for: "CHOL", "TRIG", "HDL", "CHOLHDL", "VLDL", "LDLCALC", "LDLDIRECT"  Physical Exam:    VS:  BP (!) 140/98   Pulse (!) 102   Ht 6' (1.829 m)   Wt 111.4 kg   SpO2 96%   BMI 33.31 kg/m     Wt Readings from Last 3 Encounters:  09/06/22 111.4 kg  12/13/21 134.5 kg  12/08/21 134.5 kg     GEN: Well nourished, well developed in no acute distress HEENT: Normal NECK: No JVD; No carotid bruits LYMPHATICS: No lymphadenopathy CARDIAC: S1S2 noted,RRR, no murmurs, rubs, gallops RESPIRATORY:  Clear to auscultation without rales, wheezing or rhonchi  ABDOMEN: Soft, non-tender, non-distended, +bowel sounds, no guarding. EXTREMITIES: No edema, No cyanosis, no clubbing MUSCULOSKELETAL:  No deformity  SKIN: Warm and dry NEUROLOGIC:  Alert and oriented x 3, non-focal PSYCHIATRIC:  Normal affect, good insight  ASSESSMENT:    1. Chest pain, unspecified type   2. Encounter for preprocedural laboratory examination   3. Type 2 diabetes mellitus with hyperosmolarity without coma, without long-term current use of insulin ([place])   4. Essential hypertension   5. Mixed hyperlipidemia    PLAN:    The symptoms chest pain is concerning, this patient does have intermediate risk for coronary artery disease and at this time I would like  to pursue an ischemic evaluation in this patient.  Shared decision a coronary CTA at this time is appropriate.  I have discussed with the patient about the testing.  The patient has no IV contrast allergy and is agreeable to proceed with this test.  He is hypertensive in the office.  I am going to add hydrochlorothiazide 12.5 mg to his regimen.  He is currently on amlodipine 10 mg daily, hydralazine 25 mg every 8 hours.  Losartan is listed as discontinued on his medication list but he tells me that he does not take this medication.  When he gets home please, call us with the information for his medication list.  His diabetes is being managed by his primary provider.  The patient understands the need to lose weight with diet and exercise. We have discussed specific strategies for this.  The patient is in agreement with the above plan. The patient left the office in stable condition.  The patient will follow up in 3 months  Medication Adjustments/Labs and Tests Ordered: Current medicines are reviewed at length with the patient today.  Concerns regarding medicines are outlined above.  Orders Placed This Encounter  Procedures   CT CORONARY MORPH W/CTA COR W/SCORE W/CA W/CM &/OR WO/CM   Basic Metabolic Panel (BMET)   EKG 12-Lead   Meds ordered this encounter  Medications   metoprolol tartrate (LOPRESSOR) 100 MG tablet    Sig: Take 2 hours prior to CT    Dispense:  1 tablet    Refill:  0   hydrochlorothiazide (MICROZIDE) 12.5 MG capsule    Sig: Take 1 capsule (12.5 mg total) by mouth daily.    Dispense:  90 capsule    Refill:  3    Patient Instructions  Medication Instructions:  Your physician has recommended you make the following change in your medication:  START: HCTZ 12.5mg  daily  *If you need a refill on your cardiac medications before your next appointment, please call your pharmacy*  Please update [place] via MyChart with your med list    Lab Work: Your physician recommends  that you return 1 week prior to your CTA to have a BMET drawn  If you have labs (blood work) drawn today and your tests are completely normal, you will receive your results only by: MyChart Message (if you have MyChart) OR A paper copy in the mail If you have any lab test that is abnormal or we need to change your treatment, we will call you to review the results.   Testing/Procedures: Non-Cardiac CT Angiography (CTA), is a special type of CT scan that uses a computer to produce multi-dimensional views of major blood vessels throughout the body. In CT angiography, a contrast material is injected through an IV to help visualize the blood vessels]   Follow-Up: At [facility], you and your health needs are our priority.  As part of our continuing mission to provide you with exceptional heart care, we have created designated Provider Care Teams.  These Care Teams include your primary Cardiologist (physician) and Advanced Practice Providers (APPs -  Physician Assistants and Nurse Practitioners) who all work together to provide you with the care you need, when you need it.  We recommend signing up for the patient portal called "MyChart".  Sign up information is provided on this After Visit Summary.  MyChart is used to connect with patients for Virtual Visits (Telemedicine).  Patients are able to view lab/test results, encounter notes, upcoming appointments, etc.  Non-urgent messages can be sent to your provider as well.   To learn more about what you can do with MyChart, go to [email or web address].    Your next appointment:   3 month(s)  The format for your next appointment:   In Person  Provider:   Thomasene Ripple, DO     Other Instructions   Your cardiac CT will be scheduled at one of the below locations:   [facility] [address] [place], [place] [address] [phone]  If scheduled at [facility], please arrive at the [facility] and Children's Entrance  (Entrance C2) of [facility] 30 minutes prior to test start time. You can use the FREE valet parking offered at entrance C (encouraged to control the heart rate for the test)  Proceed to the [facility] Radiology Department (first floor) to check-in and test prep.  All radiology patients and guests should use entrance C2 at [facility], accessed from [facility], even though the hospital's  physical address listed is [address].     Please follow these instructions carefully (unless otherwise directed):  Hold all erectile dysfunction medications at least 3 days (72 hrs) prior to test. (Ie viagra, cialis, sildenafil, tadalafil, etc) We will administer nitroglycerin during this exam.   On the Night Before the Test: Be sure to Drink plenty of water. Do not consume any caffeinated/decaffeinated beverages or chocolate 12 hours prior to your test. Do not take any antihistamines 12 hours prior to your test.  On the Day of the Test: Drink plenty of water until 1 hour prior to the test. Do not eat any food 1 hour prior to test. You may take your regular medications prior to the test.  Take metoprolol (Lopressor) 100mg  two hours prior to test.       After the Test: Drink plenty of water. After receiving IV contrast, you may experience a mild flushed feeling. This is normal. On occasion, you may experience a mild rash up to 24 hours after the test. This is not dangerous. If this occurs, you can take Benadryl 25 mg and increase your fluid intake. If you experience trouble breathing, this can be serious. If it is severe call 911 IMMEDIATELY. If it is mild, please call our office. If you take any of these medications: Glipizide/Metformin, Avandament, Glucavance, please do not take 48 hours after completing test unless otherwise instructed.  We will call to schedule your test 2-4 weeks out understanding that some insurance companies will need an authorization  prior to the service being performed.   For non-scheduling related questions, please contact the cardiac imaging nurse navigator should you have any questions/concerns: , Cardiac Imaging Nurse Navigator [place], Cardiac Imaging Nurse Navigator [facility] Direct Office Dial: [phone]   For scheduling needs, including cancellations and rescheduling, please call [phone], [phone].    Adopting a Healthy Lifestyle.  Know what a healthy weight is for you (roughly BMI <25) and aim to maintain this   Aim for 7+ servings of fruits and vegetables daily   65-80+ fluid ounces of water or unsweet tea for healthy kidneys   Limit to max 1 drink of alcohol per day; avoid smoking/tobacco   Limit animal fats in diet for cholesterol and heart health - choose grass fed whenever available   Avoid highly processed foods, and foods high in saturated/trans fats   Aim for low stress - take time to unwind and care for your mental health   Aim for 150 min of moderate intensity exercise weekly for heart health, and weights twice weekly for bone health   Aim for 7-9 hours of sleep daily   When it comes to diets, agreement about the perfect plan isnt easy to find, even among the experts. Experts at the [facility] of [facility] developed an idea known as the Healthy Eating Plate. Just imagine a plate divided into logical, healthy portions.   The emphasis is on diet quality:   Load up on vegetables and fruits - one-half of your plate: Aim for color and variety, and remember that potatoes dont count.   Go for whole grains - one-quarter of your plate: Whole wheat, barley, wheat berries, quinoa, oats, brown rice, and foods made with them. If you want pasta, go with whole wheat pasta.   Protein power - one-quarter of your plate: Fish, chicken, beans, and nuts are all healthy, versatile protein sources. Limit red meat.   The diet, however,  does go  beyond the plate, offering a few other suggestions.   Use healthy plant oils, such as olive, canola, soy, corn, sunflower and peanut. Check the labels, and avoid partially hydrogenated oil, which have unhealthy trans fats.   If youre thirsty, drink water. Coffee and tea are good in moderation, but skip sugary drinks and limit milk and dairy products to one or two daily servings.   The type of carbohydrate in the diet is more important than the amount. Some sources of carbohydrates, such as vegetables, fruits, whole grains, and beans-are healthier than others.   Finally, stay active  Signed, Thomasene Ripple, DO  09/06/2022 8:37 PM     [facility]

## 2022-09-06 NOTE — Patient Instructions (Addendum)
Medication Instructions:  Your physician has recommended you make the following change in your medication:  START: HCTZ 12.5mg  daily  *If you need a refill on your cardiac medications before your next appointment, please call your pharmacy*  Please update [place] via MyChart with your med list    Lab Work: Your physician recommends that you return 1 week prior to your CTA to have a BMET drawn  If you have labs (blood work) drawn today and your tests are completely normal, you will receive your results only by: [place] (if you have MyChart) OR A paper copy in the mail If you have any lab test that is abnormal or we need to change your treatment, we will call you to review the results.   Testing/Procedures: Non-Cardiac CT Angiography (CTA), is a special type of CT scan that uses a computer to produce multi-dimensional views of major blood vessels throughout the body. In CT angiography, a contrast material is injected through an IV to help visualize the blood vessels]   Follow-Up: At [facility], you and your health needs are our priority.  As part of our continuing mission to provide you with exceptional heart care, we have created designated Provider Care Teams.  These Care Teams include your primary Cardiologist (physician) and Advanced Practice Providers (APPs -  Physician Assistants and Nurse Practitioners) who all work together to provide you with the care you need, when you need it.  We recommend signing up for the patient portal called "MyChart".  Sign up information is provided on this After Visit Summary.  MyChart is used to connect with patients for Virtual Visits (Telemedicine).  Patients are able to view lab/test results, encounter notes, upcoming appointments, etc.  Non-urgent messages can be sent to your provider as well.   To learn more about what you can do with MyChart, go to [email or web address].    Your next appointment:   3 month(s)  The format for your  next appointment:   In Person  Provider:   Berniece Salines, DO     Other Instructions   Your cardiac CT will be scheduled at one of the below locations:   [facility] [address] [place], [place] [address] [phone]  If scheduled at [facility], please arrive at the [facility] and Children's Entrance (Entrance C2) of [facility] 30 minutes prior to test start time. You can use the FREE valet parking offered at entrance C (encouraged to control the heart rate for the test)  Proceed to the [facility] Radiology Department (first floor) to check-in and test prep.  All radiology patients and guests should use entrance C2 at [facility], accessed from [facility], even though the hospital's physical address listed is [address].     Please follow these instructions carefully (unless otherwise directed):  Hold all erectile dysfunction medications at least 3 days (72 hrs) prior to test. (Ie viagra, cialis, sildenafil, tadalafil, etc) We will administer nitroglycerin during this exam.   On the Night Before the Test: Be sure to Drink plenty of water. Do not consume any caffeinated/decaffeinated beverages or chocolate 12 hours prior to your test. Do not take any antihistamines 12 hours prior to your test.  On the Day of the Test: Drink plenty of water until 1 hour prior to the test. Do not eat any food 1 hour prior to test. You may take your regular medications prior to the test.  Take metoprolol (Lopressor)  100mg  two hours prior to test.       After the Test: Drink plenty of water. After receiving IV contrast, you may experience a mild flushed feeling. This is normal. On occasion, you may experience a mild rash up to 24 hours after the test. This is not dangerous. If this occurs, you can take Benadryl 25 mg and increase your fluid intake. If you experience trouble breathing, this can be serious. If it is severe call 911  IMMEDIATELY. If it is mild, please call our office. If you take any of these medications: Glipizide/Metformin, Avandament, Glucavance, please do not take 48 hours after completing test unless otherwise instructed.  We will call to schedule your test 2-4 weeks out understanding that some insurance companies will need an authorization prior to the service being performed.   For non-scheduling related questions, please contact the cardiac imaging nurse navigator should you have any questions/concerns: Marchia Bond, Cardiac Imaging Nurse Navigator [place]ordy Clement, Cardiac Imaging Nurse Navigator [facility] Direct Office Dial: [phone]   For scheduling needs, including cancellations and rescheduling, please call [place], [phone].

## 2022-09-07 ENCOUNTER — Encounter (HOSPITAL_COMMUNITY): Payer: Self-pay

## 2022-09-20 DIAGNOSIS — M722 Plantar fascial fibromatosis: Secondary | ICD-10-CM | POA: Diagnosis not present

## 2022-09-20 DIAGNOSIS — M6702 Short Achilles tendon (acquired), left ankle: Secondary | ICD-10-CM | POA: Diagnosis not present

## 2022-09-20 DIAGNOSIS — R202 Paresthesia of skin: Secondary | ICD-10-CM | POA: Diagnosis not present

## 2022-09-20 DIAGNOSIS — R200 Anesthesia of skin: Secondary | ICD-10-CM | POA: Diagnosis not present

## 2022-09-27 ENCOUNTER — Telehealth (HOSPITAL_COMMUNITY): Payer: Self-pay | Admitting: *Deleted

## 2022-09-27 NOTE — Telephone Encounter (Signed)
Attempted to call patient regarding upcoming cardiac CT appointment. °Left message on voicemail with name and callback number ° °Lejon Afzal RN Navigator Cardiac Imaging °[facility] °[phone] Office °[phone] Cell ° °

## 2022-09-28 ENCOUNTER — Ambulatory Visit (HOSPITAL_COMMUNITY)
Admission: RE | Admit: 2022-09-28 | Discharge: 2022-09-28 | Disposition: A | Discharge status: Home / Self Care | Payer: Medicaid Other | Source: Ambulatory Visit | Attending: Cardiology | Admitting: Cardiology

## 2022-09-28 ENCOUNTER — Encounter (HOSPITAL_COMMUNITY): Payer: Self-pay

## 2022-09-28 ENCOUNTER — Other Ambulatory Visit (HOSPITAL_COMMUNITY): Payer: Self-pay | Admitting: *Deleted

## 2022-09-28 DIAGNOSIS — R079 Chest pain, unspecified: Secondary | ICD-10-CM | POA: Diagnosis not present

## 2022-09-28 DIAGNOSIS — H5213 Myopia, bilateral: Secondary | ICD-10-CM | POA: Diagnosis not present

## 2022-09-28 MED ORDER — DILTIAZEM HCL 25 MG/5ML IV SOLN
INTRAVENOUS | Status: AC
  Filled 2022-09-28: qty 5

## 2022-09-28 MED ORDER — NITROGLYCERIN 0.4 MG SL SUBL: 0.8000 mg | SUBLINGUAL_TABLET | Freq: Once | SUBLINGUAL | Status: DC

## 2022-09-28 MED ORDER — DILTIAZEM HCL 25 MG/5ML IV SOLN
5.0000 mg | INTRAVENOUS | Status: AC
  Administered 2022-09-28 (×2): 5 mg via INTRAVENOUS

## 2022-09-28 MED ORDER — METOPROLOL TARTRATE 100 MG PO TABS: ORAL_TABLET | ORAL | 0 refills | Status: DC

## 2022-09-28 MED ORDER — IVABRADINE HCL 7.5 MG PO TABS: ORAL_TABLET | ORAL | 0 refills | Status: DC

## 2022-09-28 MED ORDER — METOPROLOL TARTRATE 5 MG/5ML IV SOLN
10.0000 mg | INTRAVENOUS | Status: DC | PRN
  Administered 2022-09-28: 10 mg via INTRAVENOUS

## 2022-09-28 MED ORDER — METOPROLOL TARTRATE 5 MG/5ML IV SOLN
INTRAVENOUS | Status: AC
  Filled 2022-09-28: qty 10

## 2022-09-28 NOTE — Progress Notes (Signed)
See flowsheets and MAR for vital signs and medications. Attempted to decrease patient's heart rate. Patient's heart rate remained around 75 bpm with Lopressor and Cardizem IV doses. Blood pressure lowered but still within normal range. Spoke with Dr. Oval Linsey and she would like patient to be rescheduled with Ivabradine and Lopressor and for patient to hold the amlodipine. Notified CT and CT heart navigators.

## 2022-10-03 ENCOUNTER — Telehealth (HOSPITAL_COMMUNITY): Payer: Self-pay | Admitting: *Deleted

## 2022-10-03 NOTE — Telephone Encounter (Signed)
Attempted to call patient regarding upcoming cardiac CT appointment. °Left message on voicemail with name and callback number ° °Nechama Escutia RN Navigator Cardiac Imaging ° [facility] °[phone] Office °[phone] Cell ° °

## 2022-10-04 ENCOUNTER — Ambulatory Visit (HOSPITAL_COMMUNITY): Admission: RE | Admit: 2022-10-04 | Payer: Medicaid Other | Source: Ambulatory Visit

## 2022-10-05 ENCOUNTER — Encounter (HOSPITAL_COMMUNITY): Payer: Self-pay

## 2022-10-10 ENCOUNTER — Telehealth (HOSPITAL_COMMUNITY): Payer: Self-pay | Admitting: Emergency Medicine

## 2022-10-10 NOTE — Telephone Encounter (Signed)
Attempted to call patient regarding upcoming cardiac CT appointment. °Left message on voicemail with name and callback number °Braniya Farrugia RN Navigator Cardiac Imaging °[facility] °[phone] Office °[phone] Cell ° °

## 2022-10-11 ENCOUNTER — Ambulatory Visit (HOSPITAL_COMMUNITY)
Admission: RE | Admit: 2022-10-11 | Discharge: 2022-10-11 | Disposition: A | Discharge status: Home / Self Care | Payer: 59 | Source: Ambulatory Visit | Attending: Cardiology | Admitting: Cardiology

## 2022-10-11 DIAGNOSIS — R079 Chest pain, unspecified: Secondary | ICD-10-CM | POA: Insufficient documentation

## 2022-10-11 MED ORDER — NITROGLYCERIN 0.4 MG SL SUBL
0.8000 mg | SUBLINGUAL_TABLET | Freq: Once | SUBLINGUAL | Status: AC
  Administered 2022-10-11: 0.8 mg via SUBLINGUAL

## 2022-10-11 MED ORDER — NITROGLYCERIN 0.4 MG SL SUBL
SUBLINGUAL_TABLET | SUBLINGUAL | Status: AC
  Filled 2022-10-11: qty 2

## 2022-10-11 MED ORDER — DILTIAZEM HCL 25 MG/5ML IV SOLN
5.0000 mg | Freq: Once | INTRAVENOUS | Status: AC
  Administered 2022-10-11: 5 mg via INTRAVENOUS

## 2022-10-11 MED ORDER — DILTIAZEM HCL 25 MG/5ML IV SOLN
INTRAVENOUS | Status: AC
  Filled 2022-10-11: qty 5

## 2022-10-11 MED ORDER — IOHEXOL 350 MG/ML SOLN
100.0000 mL | Freq: Once | INTRAVENOUS | Status: AC | PRN
  Administered 2022-10-11: 100 mL via INTRAVENOUS

## 2022-10-11 NOTE — Progress Notes (Signed)
Spoke with Dr. Johnsie Cancel and advised that despite giving patient 78m total of Cardizem, HR is still consistently at 74.  Patient able to get as low as 70 BPM with breath hold but does not maintain.  He advised to attempt scan if 70 BPM or below, however, if unable to maintain <70 BPM then abort scan.  Verbal read back confirmed by Dr. NJohnsie Canceland patient in agreement with plan of care at this time.

## 2022-11-10 DIAGNOSIS — E782 Mixed hyperlipidemia: Secondary | ICD-10-CM | POA: Diagnosis not present

## 2022-11-10 DIAGNOSIS — B370 Candidal stomatitis: Secondary | ICD-10-CM | POA: Diagnosis not present

## 2022-11-10 DIAGNOSIS — E1142 Type 2 diabetes mellitus with diabetic polyneuropathy: Secondary | ICD-10-CM | POA: Diagnosis not present

## 2022-11-10 DIAGNOSIS — E1129 Type 2 diabetes mellitus with other diabetic kidney complication: Secondary | ICD-10-CM | POA: Diagnosis not present

## 2022-11-10 DIAGNOSIS — I10 Essential (primary) hypertension: Secondary | ICD-10-CM | POA: Diagnosis not present

## 2022-11-10 DIAGNOSIS — R350 Frequency of micturition: Secondary | ICD-10-CM | POA: Diagnosis not present

## 2022-11-10 DIAGNOSIS — R809 Proteinuria, unspecified: Secondary | ICD-10-CM | POA: Diagnosis not present

## 2022-12-07 DIAGNOSIS — Z7985 Long-term (current) use of injectable non-insulin antidiabetic drugs: Secondary | ICD-10-CM | POA: Diagnosis not present

## 2022-12-07 DIAGNOSIS — Z7984 Long term (current) use of oral hypoglycemic drugs: Secondary | ICD-10-CM | POA: Diagnosis not present

## 2022-12-07 DIAGNOSIS — Z6832 Body mass index (BMI) 32.0-32.9, adult: Secondary | ICD-10-CM | POA: Diagnosis not present

## 2022-12-07 DIAGNOSIS — E785 Hyperlipidemia, unspecified: Secondary | ICD-10-CM | POA: Diagnosis not present

## 2022-12-07 DIAGNOSIS — Z794 Long term (current) use of insulin: Secondary | ICD-10-CM | POA: Diagnosis not present

## 2022-12-07 DIAGNOSIS — E663 Overweight: Secondary | ICD-10-CM | POA: Diagnosis not present

## 2022-12-07 DIAGNOSIS — H539 Unspecified visual disturbance: Secondary | ICD-10-CM | POA: Diagnosis not present

## 2022-12-07 DIAGNOSIS — I10 Essential (primary) hypertension: Secondary | ICD-10-CM | POA: Diagnosis not present

## 2022-12-07 DIAGNOSIS — E1142 Type 2 diabetes mellitus with diabetic polyneuropathy: Secondary | ICD-10-CM | POA: Diagnosis not present

## 2022-12-08 ENCOUNTER — Ambulatory Visit: Payer: 59 | Attending: Cardiology | Admitting: Cardiology

## 2022-12-08 ENCOUNTER — Encounter: Payer: Self-pay | Admitting: Cardiology

## 2022-12-08 VITALS — BP 164/110 | HR 83 | Ht 72.0 in | Wt 241.0 lb

## 2022-12-08 DIAGNOSIS — F439 Reaction to severe stress, unspecified: Secondary | ICD-10-CM | POA: Diagnosis not present

## 2022-12-08 DIAGNOSIS — Z79899 Other long term (current) drug therapy: Secondary | ICD-10-CM

## 2022-12-08 DIAGNOSIS — I10 Essential (primary) hypertension: Secondary | ICD-10-CM

## 2022-12-08 MED ORDER — HYDROCHLOROTHIAZIDE 25 MG PO TABS: 25.0000 mg | ORAL_TABLET | Freq: Every day | ORAL | 3 refills | Status: AC

## 2022-12-08 NOTE — Progress Notes (Unsigned)
Cardiology Office Note:    Date:  12/09/2022   ID:  Casey Garrett, DOB 23-Dec-1965, MRN [id number]  PCP:  Pcp, No  Cardiologist:  Thomasene Ripple, DO  Electrophysiologist:  None   Referring MD: No ref. provider found   " I have had some chest pain recently"   History of Present Illness:    Casey Garrett is a 57 y.o. male with a hx of mild coronary disease seen on coronary CTA, hypertension, depression, anxiety, diabetes mellitus, obesity here today for follow-up visit.  First of the patient January 2024 at that time he was experiencing chest discomfort.  Sent the patient for coronary CTA.  He did get his coronary CTA which showed evidence of mild coronary artery disease.  Today he offers no specific complaints at this time.  Most of his complaints are social based on family interaction at work.  Past Medical History:  Diagnosis Date   Anxiety    Depression    Diabetes mellitus    Hypertension    Migraines     History reviewed. No pertinent surgical history.  Current Medications: Current Meds  Medication Sig   acetaminophen (TYLENOL) 325 MG tablet Take 2 tablets (650 mg total) by mouth every 6 (six) hours as needed for headache.   amLODipine (NORVASC) 10 MG tablet Take 1 tablet (10 mg total) by mouth daily.   blood glucose meter kit and supplies KIT Dispense based on patient and insurance preference. Use up to four times daily as directed.   cyclobenzaprine (FLEXERIL) 10 MG tablet Take by mouth.   hydrochlorothiazide (HYDRODIURIL) 25 MG tablet Take 1 tablet (25 mg total) by mouth daily.   metFORMIN (GLUCOPHAGE-XR) 500 MG 24 hr tablet Take 1 tablet (500 mg total) by mouth daily with breakfast.   rosuvastatin (CRESTOR) 20 MG tablet Take 1 tablet by mouth daily.   valsartan (DIOVAN) 320 MG tablet Take 320 mg by mouth daily.   Vitamin D, Ergocalciferol, (DRISDOL) 1.25 MG (50000 UNIT) CAPS capsule Take 50,000 Units by mouth once a week.   [DISCONTINUED] hydrochlorothiazide  (MICROZIDE) 12.5 MG capsule Take 1 capsule (12.5 mg total) by mouth daily.   [DISCONTINUED] ivabradine (CORLANOR) 7.5 MG TABS tablet Take tablets (15mg ) TWO hours prior to your cardiac CT scan.   [DISCONTINUED] metoprolol tartrate (LOPRESSOR) 100 MG tablet Take 2 hours prior to CT     Allergies:   Patient has no known allergies.   Social History   Socioeconomic History   Marital status: Single    Spouse name: Not on file   Number of children: Not on file   Years of education: Not on file   Highest education level: Not on file  Occupational History   Not on file  Tobacco Use   Smoking status: Former   Smokeless tobacco: Never  Vaping Use   Vaping Use: Never used  Substance and Sexual Activity   Alcohol use: Yes    Comment: "sometimes"   Drug use: Yes    Types: Marijuana   Sexual activity: Not on file  Other Topics Concern   Not on file  Social History Narrative   Not on file   Social Determinants of Health   Financial Resource Strain: Not on file  Food Insecurity: Not on file  Transportation Needs: Not on file  Physical Activity: Not on file  Stress: Not on file  Social Connections: Not on file     Family History: The patient's family history includes Hypertension in his mother.  ROS:   Review of Systems  Constitution: Negative for decreased appetite, fever and weight gain.  HENT: Negative for congestion, ear discharge, hoarse voice and sore throat.   Eyes: Negative for discharge, redness, vision loss in right eye and visual halos.  Cardiovascular: Negative for chest pain, dyspnea on exertion, leg swelling, orthopnea and palpitations.  Respiratory: Negative for cough, hemoptysis, shortness of breath and snoring.   Endocrine: Negative for heat intolerance and polyphagia.  Hematologic/Lymphatic: Negative for bleeding problem. Does not bruise/bleed easily.  Skin: Negative for flushing, nail changes, rash and suspicious lesions.  Musculoskeletal: Negative for  arthritis, joint pain, muscle cramps, myalgias, neck pain and stiffness.  Gastrointestinal: Negative for abdominal pain, bowel incontinence, diarrhea and excessive appetite.  Genitourinary: Negative for decreased libido, genital sores and incomplete emptying.  Neurological: Negative for brief paralysis, focal weakness, headaches and loss of balance.  Psychiatric/Behavioral: Negative for altered mental status, depression and suicidal ideas.  Allergic/Immunologic: Negative for HIV exposure and persistent infections.    EKGs/Labs/Other Studies Reviewed:    The following studies were reviewed today:   EKG:  The ekg ordered today demonstrates sinus tachycardia HR 102 bpm with right bundle branch block.  Recent Labs: 12/13/2021: ALT 56; Hemoglobin 15.1; Platelets 311 12/08/2022: BUN 21; Creatinine, Ser 0.92; Magnesium 2.1; Potassium 4.8; Sodium 140  Recent Lipid Panel No results found for: "CHOL", "TRIG", "HDL", "CHOLHDL", "VLDL", "LDLCALC", "LDLDIRECT"  Physical Exam:    VS:  BP (!) 164/110   Pulse 83   Ht 6' (1.829 m)   Wt 241 lb (109.3 kg)   SpO2 99%   BMI 32.69 kg/m     Wt Readings from Last 3 Encounters:  12/08/22 241 lb (109.3 kg)  09/06/22 245 lb 9.6 oz (111.4 kg)  12/13/21 296 lb 8.3 oz (134.5 kg)     GEN: Well nourished, well developed in no acute distress HEENT: Normal NECK: No JVD; No carotid bruits LYMPHATICS: No lymphadenopathy CARDIAC: S1S2 noted,RRR, no murmurs, rubs, gallops RESPIRATORY:  Clear to auscultation without rales, wheezing or rhonchi  ABDOMEN: Soft, non-tender, non-distended, +bowel sounds, no guarding. EXTREMITIES: No edema, No cyanosis, no clubbing MUSCULOSKELETAL:  No deformity  SKIN: Warm and dry NEUROLOGIC:  Alert and oriented x 3, non-focal PSYCHIATRIC:  Normal affect, good insight  ASSESSMENT:    1. Medication management   2. Stress   3. Essential hypertension   4. Obesity, Class III, BMI 40-49.9 (morbid obesity)     PLAN:     Coronary CTA discussed.  There is mild coronary artery disease.  He is currently on rosuvastatin we will continue this.  His target LDL goal will be less than 70 at this time.  He is hypertensive in the office.  I am going to increase his HCTZ to 25 mg daily.  Will continue amlodipine 10 mg daily as well as valsartan 320 mg daily.  I am somewhat concerned about medication adherence in this patient but this is just the suspicion.   His diabetes is being managed by his primary provider.  The patient understands the need to lose weight with diet and exercise. We have discussed specific strategies for this.  The patient is in agreement with the above plan. The patient left the office in stable condition.  The patient will follow up in 3 months    Medication Adjustments/Labs and Tests Ordered: Current medicines are reviewed at length with the patient today.  Concerns regarding medicines are outlined above.  Orders Placed This Encounter  Procedures  Basic Metabolic Panel (BMET)   Magnesium   Ambulatory referral to Psychology   Meds ordered this encounter  Medications   hydrochlorothiazide (HYDRODIURIL) 25 MG tablet    Sig: Take 1 tablet (25 mg total) by mouth daily.    Dispense:  90 tablet    Refill:  3    Patient Instructions  Medication Instructions:  Your physician has recommended you make the following change in your medication:  INCREASE: Hydrochlorothiazide 25 mg once daily *If you need a refill on your cardiac medications before your next appointment, please call your pharmacy*   Lab Work: Your physician recommends that you have labs drawn today: BMET, Mag  If you have labs (blood work) drawn today and your tests are completely normal, you will receive your results only by: MyChart Message (if you have MyChart) OR A paper copy in the mail If you have any lab test that is abnormal or we need to change your treatment, we will call you to review the  results.   Testing/Procedures: None   Follow-Up: At [facility], you and your health needs are our priority.  As part of our continuing mission to provide you with exceptional heart care, we have created designated Provider Care Teams.  These Care Teams include your primary Cardiologist (physician) and Advanced Practice Providers (APPs -  Physician Assistants and Nurse Practitioners) who all work together to provide you with the care you need, when you need it.  Your next appointment:   1 year(s)  Provider:   Thomasene Ripple, DO     Other instructions: Hilbert Corrigan, PsyD Psychologist in [place], [facility] Address: [address], [place], [place] [address] Phone: [phone]     Adopting a Healthy Lifestyle.  Know what a healthy weight is for you (roughly BMI <25) and aim to maintain this   Aim for 7+ servings of fruits and vegetables daily   65-80+ fluid ounces of water or unsweet tea for healthy kidneys   Limit to max 1 drink of alcohol per day; avoid smoking/tobacco   Limit animal fats in diet for cholesterol and heart health - choose grass fed whenever available   Avoid highly processed foods, and foods high in saturated/trans fats   Aim for low stress - take time to unwind and care for your mental health   Aim for 150 min of moderate intensity exercise weekly for heart health, and weights twice weekly for bone health   Aim for 7-9 hours of sleep daily   When it comes to diets, agreement about the perfect plan isnt easy to find, even among the experts. Experts at the [facility] [employer] developed an idea known as the Healthy Eating Plate. Just imagine a plate divided into logical, healthy portions.   The emphasis is on diet quality:   Load up on vegetables and fruits - one-half of your plate: Aim for color and variety, and remember that potatoes dont count.   Go for whole grains - one-quarter of your plate: Whole  wheat, barley, wheat berries, quinoa, oats, brown rice, and foods made with them. If you want pasta, go with whole wheat pasta.   Protein power - one-quarter of your plate: Fish, chicken, beans, and nuts are all healthy, versatile protein sources. Limit red meat.   The diet, however, does go beyond the plate, offering a few other suggestions.   Use healthy plant oils, such as olive, canola, soy, corn, sunflower and peanut. Check the labels,  and avoid partially hydrogenated oil, which have unhealthy trans fats.   If youre thirsty, drink water. Coffee and tea are good in moderation, but skip sugary drinks and limit milk and dairy products to one or two daily servings.   The type of carbohydrate in the diet is more important than the amount. Some sources of carbohydrates, such as vegetables, fruits, whole grains, and beans-are healthier than others.   Finally, stay active  Signed, Thomasene Ripple, DO  12/09/2022 5:04 PM    [facility]

## 2022-12-08 NOTE — Patient Instructions (Addendum)
Medication Instructions:  Your physician has recommended you make the following change in your medication:  INCREASE: Hydrochlorothiazide 25 mg once daily *If you need a refill on your cardiac medications before your next appointment, please call your pharmacy*   Lab Work: Your physician recommends that you have labs drawn today: BMET, Mag  If you have labs (blood work) drawn today and your tests are completely normal, you will receive your results only by: MyChart Message (if you have MyChart) OR A paper copy in the mail If you have any lab test that is abnormal or we need to change your treatment, we will call you to review the results.   Testing/Procedures: None   Follow-Up: At [facility], you and your health needs are our priority.  As part of our continuing mission to provide you with exceptional heart care, we have created designated Provider Care Teams.  These Care Teams include your primary Cardiologist (physician) and Advanced Practice Providers (APPs -  Physician Assistants and Nurse Practitioners) who all work together to provide you with the care you need, when you need it.  Your next appointment:   1 year(s)  Provider:   Thomasene Ripple, DO     Other instructions: Hilbert Corrigan, PsyD Psychologist in [place], [facility] Address: [address], [place], [place] [address] Phone: [phone]

## 2022-12-09 LAB — BASIC METABOLIC PANEL
BUN/Creatinine Ratio: 23 — ABNORMAL HIGH (ref 9–20)
BUN: 21 mg/dL (ref 6–24)
CO2: 25 mmol/L (ref 20–29)
Calcium: 9.2 mg/dL (ref 8.7–10.2)
Chloride: 100 mmol/L (ref 96–106)
Creatinine, Ser: 0.92 mg/dL (ref 0.76–1.27)
Glucose: 308 mg/dL — ABNORMAL HIGH (ref 70–99)
Potassium: 4.8 mmol/L (ref 3.5–5.2)
Sodium: 140 mmol/L (ref 134–144)
eGFR: 98 mL/min/{1.73_m2} (ref >59)

## 2022-12-09 LAB — MAGNESIUM: Magnesium: 2.1 mg/dL (ref 1.6–2.3)

## 2023-01-08 ENCOUNTER — Other Ambulatory Visit: Payer: Self-pay

## 2023-01-08 ENCOUNTER — Encounter (HOSPITAL_COMMUNITY): Payer: Self-pay

## 2023-01-08 ENCOUNTER — Emergency Department (HOSPITAL_COMMUNITY): Payer: 59

## 2023-01-08 ENCOUNTER — Emergency Department (HOSPITAL_COMMUNITY)
Admission: EM | Admit: 2023-01-08 | Discharge: 2023-01-08 | Disposition: A | Discharge status: Home / Self Care | Payer: 59 | Attending: Emergency Medicine | Admitting: Emergency Medicine

## 2023-01-08 DIAGNOSIS — W540XXA Bitten by dog, initial encounter: Secondary | ICD-10-CM | POA: Diagnosis not present

## 2023-01-08 DIAGNOSIS — S61411A Laceration without foreign body of right hand, initial encounter: Secondary | ICD-10-CM | POA: Insufficient documentation

## 2023-01-08 DIAGNOSIS — S60511A Abrasion of right hand, initial encounter: Secondary | ICD-10-CM | POA: Diagnosis not present

## 2023-01-08 DIAGNOSIS — S61451A Open bite of right hand, initial encounter: Secondary | ICD-10-CM | POA: Diagnosis not present

## 2023-01-08 MED ORDER — LIDOCAINE-EPINEPHRINE-TETRACAINE (LET) TOPICAL GEL
3.0000 mL | Freq: Once | TOPICAL | Status: AC
  Administered 2023-01-08: 3 mL via TOPICAL
  Filled 2023-01-08: qty 3

## 2023-01-08 MED ORDER — AMOXICILLIN-POT CLAVULANATE 875-125 MG PO TABS: 1.0000 | ORAL_TABLET | Freq: Two times a day (BID) | ORAL | 0 refills | Status: AC

## 2023-01-08 NOTE — Discharge Instructions (Addendum)
You were seen in the emergency department for a dog bite. Your xray imaging was negative for any evidence of any signs of a deeper penetration from the bite itself. Given that this is a dog bite, we typically do not close these wounds as this can increase the risk of infection. You have also been sent a prescription for Augmentin, an antibiotic you should take twice daily for the next 7 days. I have provided you with information for a hand specialist you can reach out to for follow up to ensure the area is healing well without issues. If you notice signs of infection, please return to the emergency department.

## 2023-01-08 NOTE — ED Triage Notes (Signed)
Pt bit on right hand by dog, unsure of vaccination status, multiple small puncture wounds to hand, bleeding controlled.

## 2023-01-08 NOTE — ED Provider Notes (Signed)
 [facility] EMERGENCY DEPARTMENT AT [facility] Provider Note   CSN: [id number] Arrival date & time: 01/08/23  1325     History Chief Complaint  Patient presents with   Animal Bite    Casey Garrett is a 57 y.o. male.  Patient presents emergency department with an animal bite.  Reports that he was bitten by a dog while at work.  Unsure of vaccination status.  Dog was domesticated and with 1 at the time. Patient up to date on Tdap. Denies any difficulty moving hand/fingers. Sensation intact. No active bleeding.   Animal Bite      Home Medications Prior to Admission medications   Medication Sig Start Date End Date Taking? Authorizing Provider  amoxicillin-clavulanate (AUGMENTIN) 875-125 MG tablet Take 1 tablet by mouth every 12 (twelve) hours for 7 days. 01/08/23 01/15/23 Yes Smitty Knudsen, PA-C  acetaminophen (TYLENOL) 325 MG tablet Take 2 tablets (650 mg total) by mouth every 6 (six) hours as needed for headache. 12/10/21   Osvaldo Shipper, MD  amLODipine (NORVASC) 10 MG tablet Take 1 tablet (10 mg total) by mouth daily. 12/10/21   Osvaldo Shipper, MD  blood glucose meter kit and supplies KIT Dispense based on patient and insurance preference. Use up to four times daily as directed. 12/10/21   Osvaldo Shipper, MD  cyclobenzaprine (FLEXERIL) 10 MG tablet Take by mouth. 08/29/22   [provider]  gabapentin (NEURONTIN) 100 MG capsule Take 1 capsule (100 mg total) by mouth 3 (three) times daily. 12/13/21 09/06/22  Redwine, Madison A, PA-C  hydrochlorothiazide (HYDRODIURIL) 25 MG tablet Take 1 tablet (25 mg total) by mouth daily. 12/08/22   Tobb, Kardie, DO  metFORMIN (GLUCOPHAGE-XR) 500 MG 24 hr tablet Take 1 tablet (500 mg total) by mouth daily with breakfast. 12/10/21   Osvaldo Shipper, MD  rosuvastatin (CRESTOR) 20 MG tablet Take 1 tablet by mouth daily. 06/21/22   [provider]  valsartan (DIOVAN) 320 MG tablet Take 320 mg by mouth daily.    [provider]  Vitamin D, Ergocalciferol, (DRISDOL) 1.25 MG (50000 UNIT) CAPS capsule Take 50,000 Units by mouth once a week. 07/07/22   [provider]  losartan (COZAAR) 100 MG tablet Take 1 tablet (100 mg total) by mouth daily. 10/08/18 01/20/21  Nira Conn, MD      Allergies    Patient has no known allergies.    Review of Systems   Review of Systems  Skin:  Positive for wound.  All other systems reviewed and are negative.   Physical Exam Updated Vital Signs BP (!) 154/75 (BP Location: Right Arm)   Pulse 95   Temp 99.5 F (37.5 C) (Oral)   Resp 16   Ht 6' (1.829 m)   Wt 106.6 kg   SpO2 98%   BMI 31.87 kg/m  Physical Exam Vitals and nursing note reviewed.  Constitutional:      Appearance: Normal appearance.  HENT:     Head: Normocephalic and atraumatic.  Eyes:     General: No scleral icterus.       Right eye: No discharge.        Left eye: No discharge.  Cardiovascular:     Rate and Rhythm: Normal rate and regular rhythm.  Skin:    General: Skin is warm.     Findings: Lesion present. No rash.     Comments: Multiple abrasion noted to posterior aspect of right hand. A small 3cm laceration noted to right palm  with minimal depth.  Neurological:     Mental Status: He is alert.     ED Results / Procedures / Treatments   Labs (all labs ordered are listed, but only abnormal results are displayed) Labs Reviewed - No data to display  EKG None  Radiology DG Hand Complete Right  Result Date: 01/08/2023 CLINICAL DATA:  Dog bite.  Small puncture wounds. EXAM: RIGHT HAND - COMPLETE 3 VIEW COMPARISON:  None Available. FINDINGS: On the lateral view there is a tiny density seen dorsal along the distal interphalangeal joint what appears to be the third digit. Possible injury. Please correlate for any focal pain. This could be acute versus chronic. There is chronic appearing deformity of the distal aspect of the fourth metacarpal. Please correlate for old  injury. No acute fracture or dislocation otherwise. Preserved bone mineralization. Subchondral cyst formation seen along the distal aspect of the middle phalanx of third digit. No definite radiopaque foreign bodies. Please correlate for location of puncture wounds. IMPRESSION: Tiny density seen dorsally about the distal interphalangeal joint of what may be the third digit. Please correlate tiny point tenderness. This could be chronic. Chronic deformity of fourth metacarpal. Electronically Signed   By: Karen Kays M.D.   On: 01/08/2023 14:41    Procedures Procedures   Medications Ordered in ED Medications  lidocaine-EPINEPHrine-tetracaine (LET) topical gel (3 mLs Topical Given 01/08/23 1522)    ED Course/ Medical Decision Making/ A&P                           Medical Decision Making Amount and/or Complexity of Data Reviewed Radiology: ordered.  Risk Prescription drug management.   This patient presents to the ED for concern of animal bite.  Differential diagnosis includes dog bite, rabies treatment, cellulitis, de Quervain's tenosynovitis   Imaging Studies ordered:  I ordered imaging studies including x-ray of right hand I independently visualized and interpreted imaging which showed no acute abnormalities I agree with the radiologist interpretation   Medicines ordered and prescription drug management:  I ordered medication including let gel for anesthesia Reevaluation of the patient after these medicines showed that the patient improved I have reviewed the patients home medicines and have made adjustments as needed   Problem List / ED Course:  Patient presents emergency department complaints of a dog bite.  He reports that he was at a job site when a dog from a home approached him and he tried to pet the dog but the dog bit his hand.  He reports that he had some bleeding following this but bleeding was controlled.  Unsure of dog's vaccination status but dog is a Aeronautical engineer.  Denies any difficulty moving hands or any significant punctures.  No limitations range of motion segmental concern for tendon injury. Informed patient that closure of dog bites is typically not advised due to risk of infection, but patient preferred to have some areas loosely closed. Dermabond applied to one site with laceration left primarily opened to allow for drainage. Augmentin sent to patient's pharmacy with instructions to take this medication as prescribed to reduce the risk of infection or complications. Advised patient to return to the emergency department if symptoms worsen or begins to experience a quick spreading erytthematous streak concerning for cellulitis. Patient agreeable with treatment plan and verbalized understanding all return precautions.  Final Clinical Impression(s) / ED Diagnoses Final diagnoses:  Dog bite, initial encounter    Rx / DC  Orders ED Discharge Orders          Ordered    amoxicillin-clavulanate (AUGMENTIN) 875-125 MG tablet  Every 12 hours        01/08/23 1614              Salomon Mast 01/09/23 2122    Gwyneth Sprout, MD 01/09/23 2326

## 2023-01-17 DIAGNOSIS — Z794 Long term (current) use of insulin: Secondary | ICD-10-CM | POA: Diagnosis not present

## 2023-01-17 DIAGNOSIS — E1142 Type 2 diabetes mellitus with diabetic polyneuropathy: Secondary | ICD-10-CM | POA: Diagnosis not present

## 2023-01-31 DIAGNOSIS — E1142 Type 2 diabetes mellitus with diabetic polyneuropathy: Secondary | ICD-10-CM | POA: Diagnosis not present

## 2023-01-31 DIAGNOSIS — Z794 Long term (current) use of insulin: Secondary | ICD-10-CM | POA: Diagnosis not present

## 2023-01-31 DIAGNOSIS — E1165 Type 2 diabetes mellitus with hyperglycemia: Secondary | ICD-10-CM | POA: Diagnosis not present

## 2023-01-31 DIAGNOSIS — Z7984 Long term (current) use of oral hypoglycemic drugs: Secondary | ICD-10-CM | POA: Diagnosis not present

## 2023-02-19 DIAGNOSIS — E1142 Type 2 diabetes mellitus with diabetic polyneuropathy: Secondary | ICD-10-CM | POA: Diagnosis not present

## 2023-02-19 DIAGNOSIS — Z794 Long term (current) use of insulin: Secondary | ICD-10-CM | POA: Diagnosis not present

## 2023-04-16 DIAGNOSIS — M791 Myalgia, unspecified site: Secondary | ICD-10-CM | POA: Diagnosis not present

## 2023-04-16 DIAGNOSIS — N3000 Acute cystitis without hematuria: Secondary | ICD-10-CM | POA: Diagnosis not present

## 2023-04-16 DIAGNOSIS — R109 Unspecified abdominal pain: Secondary | ICD-10-CM | POA: Diagnosis not present

## 2023-04-16 DIAGNOSIS — K5900 Constipation, unspecified: Secondary | ICD-10-CM | POA: Diagnosis not present

## 2023-04-16 DIAGNOSIS — R369 Urethral discharge, unspecified: Secondary | ICD-10-CM | POA: Diagnosis not present

## 2023-05-02 DIAGNOSIS — E559 Vitamin D deficiency, unspecified: Secondary | ICD-10-CM | POA: Diagnosis not present

## 2023-05-02 DIAGNOSIS — M545 Low back pain, unspecified: Secondary | ICD-10-CM | POA: Diagnosis not present

## 2023-05-02 DIAGNOSIS — Z794 Long term (current) use of insulin: Secondary | ICD-10-CM | POA: Diagnosis not present

## 2023-05-02 DIAGNOSIS — E782 Mixed hyperlipidemia: Secondary | ICD-10-CM | POA: Diagnosis not present

## 2023-05-02 DIAGNOSIS — E1142 Type 2 diabetes mellitus with diabetic polyneuropathy: Secondary | ICD-10-CM | POA: Diagnosis not present

## 2023-05-02 DIAGNOSIS — G8929 Other chronic pain: Secondary | ICD-10-CM | POA: Diagnosis not present

## 2023-05-03 DIAGNOSIS — E1142 Type 2 diabetes mellitus with diabetic polyneuropathy: Secondary | ICD-10-CM | POA: Diagnosis not present

## 2023-05-03 DIAGNOSIS — E559 Vitamin D deficiency, unspecified: Secondary | ICD-10-CM | POA: Diagnosis not present

## 2023-05-03 DIAGNOSIS — Z794 Long term (current) use of insulin: Secondary | ICD-10-CM | POA: Diagnosis not present

## 2023-05-03 DIAGNOSIS — E782 Mixed hyperlipidemia: Secondary | ICD-10-CM | POA: Diagnosis not present

## 2023-05-24 DIAGNOSIS — E1129 Type 2 diabetes mellitus with other diabetic kidney complication: Secondary | ICD-10-CM | POA: Diagnosis not present

## 2023-05-24 DIAGNOSIS — I10 Essential (primary) hypertension: Secondary | ICD-10-CM | POA: Diagnosis not present

## 2023-05-24 DIAGNOSIS — N179 Acute kidney failure, unspecified: Secondary | ICD-10-CM | POA: Diagnosis not present

## 2023-05-24 DIAGNOSIS — Z791 Long term (current) use of non-steroidal anti-inflammatories (NSAID): Secondary | ICD-10-CM | POA: Diagnosis not present

## 2023-05-24 DIAGNOSIS — Z87891 Personal history of nicotine dependence: Secondary | ICD-10-CM | POA: Diagnosis not present

## 2023-05-24 DIAGNOSIS — R808 Other proteinuria: Secondary | ICD-10-CM | POA: Diagnosis not present

## 2023-05-24 DIAGNOSIS — E669 Obesity, unspecified: Secondary | ICD-10-CM | POA: Diagnosis not present

## 2023-05-24 DIAGNOSIS — Z7984 Long term (current) use of oral hypoglycemic drugs: Secondary | ICD-10-CM | POA: Diagnosis not present

## 2023-05-24 DIAGNOSIS — Z7985 Long-term (current) use of injectable non-insulin antidiabetic drugs: Secondary | ICD-10-CM | POA: Diagnosis not present

## 2023-05-24 DIAGNOSIS — E1142 Type 2 diabetes mellitus with diabetic polyneuropathy: Secondary | ICD-10-CM | POA: Diagnosis not present

## 2023-05-24 DIAGNOSIS — Z794 Long term (current) use of insulin: Secondary | ICD-10-CM | POA: Diagnosis not present

## 2023-06-25 DIAGNOSIS — Z794 Long term (current) use of insulin: Secondary | ICD-10-CM | POA: Diagnosis not present

## 2023-06-25 DIAGNOSIS — E1165 Type 2 diabetes mellitus with hyperglycemia: Secondary | ICD-10-CM | POA: Diagnosis not present

## 2023-07-05 DIAGNOSIS — R319 Hematuria, unspecified: Secondary | ICD-10-CM | POA: Diagnosis not present

## 2023-07-05 DIAGNOSIS — R1031 Right lower quadrant pain: Secondary | ICD-10-CM | POA: Diagnosis not present

## 2023-07-05 DIAGNOSIS — K625 Hemorrhage of anus and rectum: Secondary | ICD-10-CM | POA: Diagnosis not present

## 2023-07-05 DIAGNOSIS — N390 Urinary tract infection, site not specified: Secondary | ICD-10-CM | POA: Diagnosis not present

## 2023-07-05 DIAGNOSIS — G8929 Other chronic pain: Secondary | ICD-10-CM | POA: Diagnosis not present

## 2023-07-05 DIAGNOSIS — M545 Low back pain, unspecified: Secondary | ICD-10-CM | POA: Diagnosis not present

## 2023-07-05 DIAGNOSIS — Z1211 Encounter for screening for malignant neoplasm of colon: Secondary | ICD-10-CM | POA: Diagnosis not present

## 2023-07-23 DIAGNOSIS — R1031 Right lower quadrant pain: Secondary | ICD-10-CM | POA: Diagnosis not present

## 2023-07-23 DIAGNOSIS — Z113 Encounter for screening for infections with a predominantly sexual mode of transmission: Secondary | ICD-10-CM | POA: Diagnosis not present

## 2023-07-23 DIAGNOSIS — R944 Abnormal results of kidney function studies: Secondary | ICD-10-CM | POA: Diagnosis not present

## 2023-08-08 DIAGNOSIS — R1031 Right lower quadrant pain: Secondary | ICD-10-CM | POA: Diagnosis not present

## 2023-08-20 DIAGNOSIS — N433 Hydrocele, unspecified: Secondary | ICD-10-CM | POA: Diagnosis not present

## 2023-09-29 DIAGNOSIS — R739 Hyperglycemia, unspecified: Secondary | ICD-10-CM | POA: Diagnosis not present

## 2023-11-05 DIAGNOSIS — R051 Acute cough: Secondary | ICD-10-CM | POA: Diagnosis not present

## 2023-11-05 DIAGNOSIS — Z794 Long term (current) use of insulin: Secondary | ICD-10-CM | POA: Diagnosis not present

## 2023-11-05 DIAGNOSIS — E1142 Type 2 diabetes mellitus with diabetic polyneuropathy: Secondary | ICD-10-CM | POA: Diagnosis not present

## 2023-11-05 DIAGNOSIS — R197 Diarrhea, unspecified: Secondary | ICD-10-CM | POA: Diagnosis not present

## 2023-11-08 DIAGNOSIS — J339 Nasal polyp, unspecified: Secondary | ICD-10-CM | POA: Diagnosis not present

## 2023-12-20 DIAGNOSIS — M545 Low back pain, unspecified: Secondary | ICD-10-CM | POA: Diagnosis not present

## 2023-12-20 DIAGNOSIS — M7732 Calcaneal spur, left foot: Secondary | ICD-10-CM | POA: Diagnosis not present

## 2023-12-20 DIAGNOSIS — M79674 Pain in right toe(s): Secondary | ICD-10-CM | POA: Diagnosis not present

## 2023-12-20 DIAGNOSIS — M79675 Pain in left toe(s): Secondary | ICD-10-CM | POA: Diagnosis not present

## 2024-01-11 DIAGNOSIS — M545 Low back pain, unspecified: Secondary | ICD-10-CM | POA: Diagnosis not present

## 2024-01-11 DIAGNOSIS — Z794 Long term (current) use of insulin: Secondary | ICD-10-CM | POA: Diagnosis not present

## 2024-01-11 DIAGNOSIS — N521 Erectile dysfunction due to diseases classified elsewhere: Secondary | ICD-10-CM | POA: Diagnosis not present

## 2024-01-11 DIAGNOSIS — A599 Trichomoniasis, unspecified: Secondary | ICD-10-CM | POA: Diagnosis not present

## 2024-01-11 DIAGNOSIS — I10 Essential (primary) hypertension: Secondary | ICD-10-CM | POA: Diagnosis not present

## 2024-01-11 DIAGNOSIS — Z113 Encounter for screening for infections with a predominantly sexual mode of transmission: Secondary | ICD-10-CM | POA: Diagnosis not present

## 2024-01-11 DIAGNOSIS — E1142 Type 2 diabetes mellitus with diabetic polyneuropathy: Secondary | ICD-10-CM | POA: Diagnosis not present

## 2024-01-11 DIAGNOSIS — G8929 Other chronic pain: Secondary | ICD-10-CM | POA: Diagnosis not present

## 2024-01-23 DIAGNOSIS — E1165 Type 2 diabetes mellitus with hyperglycemia: Secondary | ICD-10-CM | POA: Diagnosis not present

## 2024-01-23 DIAGNOSIS — Z794 Long term (current) use of insulin: Secondary | ICD-10-CM | POA: Diagnosis not present

## 2024-02-16 DIAGNOSIS — S99921A Unspecified injury of right foot, initial encounter: Secondary | ICD-10-CM | POA: Diagnosis not present

## 2024-02-16 DIAGNOSIS — S92514A Nondisplaced fracture of proximal phalanx of right lesser toe(s), initial encounter for closed fracture: Secondary | ICD-10-CM | POA: Diagnosis not present

## 2024-04-10 DIAGNOSIS — Z794 Long term (current) use of insulin: Secondary | ICD-10-CM | POA: Diagnosis not present

## 2024-04-10 DIAGNOSIS — E1142 Type 2 diabetes mellitus with diabetic polyneuropathy: Secondary | ICD-10-CM | POA: Diagnosis not present

## 2024-04-10 DIAGNOSIS — R413 Other amnesia: Secondary | ICD-10-CM | POA: Diagnosis not present

## 2024-04-10 DIAGNOSIS — E1140 Type 2 diabetes mellitus with diabetic neuropathy, unspecified: Secondary | ICD-10-CM | POA: Diagnosis not present

## 2024-05-09 DIAGNOSIS — M25521 Pain in right elbow: Secondary | ICD-10-CM | POA: Diagnosis not present

## 2024-05-09 DIAGNOSIS — K219 Gastro-esophageal reflux disease without esophagitis: Secondary | ICD-10-CM | POA: Diagnosis not present

## 2024-05-09 DIAGNOSIS — M25511 Pain in right shoulder: Secondary | ICD-10-CM | POA: Diagnosis not present

## 2024-05-09 DIAGNOSIS — L84 Corns and callosities: Secondary | ICD-10-CM | POA: Diagnosis not present

## 2024-06-03 ENCOUNTER — Other Ambulatory Visit: Payer: Self-pay

## 2024-06-03 ENCOUNTER — Emergency Department (HOSPITAL_COMMUNITY)
Admission: EM | Admit: 2024-06-03 | Discharge: 2024-06-04 | Disposition: A | Discharge status: Home / Self Care | Attending: Emergency Medicine | Admitting: Emergency Medicine

## 2024-06-03 ENCOUNTER — Encounter (HOSPITAL_COMMUNITY): Payer: Self-pay

## 2024-06-03 ENCOUNTER — Emergency Department (HOSPITAL_COMMUNITY)

## 2024-06-03 DIAGNOSIS — E119 Type 2 diabetes mellitus without complications: Secondary | ICD-10-CM | POA: Insufficient documentation

## 2024-06-03 DIAGNOSIS — R52 Pain, unspecified: Secondary | ICD-10-CM

## 2024-06-03 DIAGNOSIS — E860 Dehydration: Secondary | ICD-10-CM | POA: Diagnosis not present

## 2024-06-03 DIAGNOSIS — I10 Essential (primary) hypertension: Secondary | ICD-10-CM | POA: Diagnosis not present

## 2024-06-03 DIAGNOSIS — M791 Myalgia, unspecified site: Secondary | ICD-10-CM | POA: Diagnosis not present

## 2024-06-03 DIAGNOSIS — R079 Chest pain, unspecified: Secondary | ICD-10-CM | POA: Insufficient documentation

## 2024-06-03 DIAGNOSIS — Z87891 Personal history of nicotine dependence: Secondary | ICD-10-CM | POA: Diagnosis not present

## 2024-06-03 DIAGNOSIS — R112 Nausea with vomiting, unspecified: Secondary | ICD-10-CM | POA: Diagnosis not present

## 2024-06-03 LAB — CBC
HCT: 53.1 % — ABNORMAL HIGH (ref 39.0–52.0)
Hemoglobin: 17.9 g/dL — ABNORMAL HIGH (ref 13.0–17.0)
MCH: 28.6 pg (ref 26.0–34.0)
MCHC: 33.7 g/dL (ref 30.0–36.0)
MCV: 85 fL (ref 80.0–100.0)
Platelets: 278 K/uL (ref 150–400)
RBC: 6.25 MIL/uL — ABNORMAL HIGH (ref 4.22–5.81)
RDW: 12.2 % (ref 11.5–15.5)
WBC: 10.8 K/uL — ABNORMAL HIGH (ref 4.0–10.5)
nRBC: 0 % (ref 0.0–0.2)

## 2024-06-03 LAB — BASIC METABOLIC PANEL WITH GFR
Anion gap: 14 (ref 5–15)
BUN: 28 mg/dL — ABNORMAL HIGH (ref 6–20)
CO2: 24 mmol/L (ref 22–32)
Calcium: 10.2 mg/dL (ref 8.9–10.3)
Chloride: 97 mmol/L — ABNORMAL LOW (ref 98–111)
Creatinine, Ser: 1.24 mg/dL (ref 0.61–1.24)
GFR, Estimated: >60 mL/min (ref >60)
Glucose, Bld: 171 mg/dL — ABNORMAL HIGH (ref 70–99)
Potassium: 3.9 mmol/L (ref 3.5–5.1)
Sodium: 135 mmol/L (ref 135–145)

## 2024-06-03 LAB — TROPONIN I (HIGH SENSITIVITY)
Troponin I (High Sensitivity): 3 ng/L (ref <18)
Troponin I (High Sensitivity): 3 ng/L (ref <18)

## 2024-06-03 MED ORDER — ACETAMINOPHEN 500 MG PO TABS: 1000.0000 mg | ORAL_TABLET | ORAL | Status: DC

## 2024-06-03 NOTE — ED Triage Notes (Signed)
 Pt is coming in for a cough that leads him to vomit, when he vomits he mentions nothing come up other than bile and mucus, after the wretching episode he explains he then has chest pain and back pain. He went to [facility] to be seen and they sent him to the ER for further evaluation. He is otherwise stable at this time in triage, abd does seem to be slightly distended.

## 2024-06-04 MED ORDER — NAPROXEN 500 MG PO TABS: 500.0000 mg | ORAL_TABLET | Freq: Two times a day (BID) | ORAL | 0 refills | Status: AC

## 2024-06-04 MED ORDER — ONDANSETRON 4 MG PO TBDP: 4.0000 mg | ORAL_TABLET | Freq: Three times a day (TID) | ORAL | 0 refills | Status: AC | PRN

## 2024-06-04 NOTE — ED Provider Notes (Signed)
 [facility] Emergency Department Provider Note MRN:  [id number]  Arrival date & time: 06/04/24     Chief Complaint   Chest Pain   History of Present Illness   Casey Garrett is a 58 y.o. year-old male with a history of hypertension, diabetes presenting to the ED with chief complaint of chest pain.  Patient began having symptoms of bodyaches about a week ago.  Then began having nausea followed by vomiting.  Had some abdominal discomfort during this time.  Then had some chest discomfort described as burning, associated with frequent belching.  Now having a headache.  Review of Systems  A thorough review of systems was obtained and all systems are negative except as noted in the HPI and PMH.   Patient's Health History    Past Medical History:  Diagnosis Date   Anxiety    Depression    Diabetes mellitus (HCC)    Hypertension    Migraines     History reviewed. No pertinent surgical history.  Family History  Problem Relation Age of Onset   Hypertension Mother     Social History   Socioeconomic History   Marital status: Single    Spouse name: Not on file   Number of children: Not on file   Years of education: Not on file   Highest education level: Not on file  Occupational History   Not on file  Tobacco Use   Smoking status: Former   Smokeless tobacco: Never  Vaping Use   Vaping status: Never Used  Substance and Sexual Activity   Alcohol use: Yes    Comment: sometimes   Drug use: Yes    Types: Marijuana   Sexual activity: Not on file  Other Topics Concern   Not on file  Social History Narrative   Not on file   Social Drivers of Health   Financial Resource Strain: Not on file  Food Insecurity: Low Risk  (01/23/2024)   Received from [facility]   Hunger Vital Sign    Within the past 12 months, you worried that your food would run out before you got money to buy more: Never true    Within the past 12 months, the food you bought  just didn't last and you didn't have money to get more. : Never true  Transportation Needs: No Transportation Needs (01/23/2024)   Received from [facility]    In the past 12 months, has lack of reliable transportation kept you from medical appointments, meetings, work or from getting things needed for daily living? : No  Physical Activity: Not on file  Stress: Not on file  Social Connections: Unknown (01/06/2022)   Received from [facility]   Social Network    Social Network: Not on file  Intimate Partner Violence: Unknown (11/28/2021)   Received from [facility]   HITS    Physically Hurt: Not on file    Insult or Talk Down To: Not on file    Threaten Physical Harm: Not on file    Scream or Curse: Not on file     Physical Exam   Vitals:   06/03/24 1857 06/03/24 2209  BP: (!) 156/100 (!) 169/97  Pulse: (!) 111 (!) 106  Resp: 18 19  Temp: 98.2 F (36.8 C) 98.3 F (36.8 C)  SpO2: 94% 93%    CONSTITUTIONAL: Well-appearing, NAD NEURO/PSYCH:  Alert and oriented x 3, no focal deficits EYES:  eyes equal and reactive ENT/NECK:  no LAD, no JVD CARDIO: Regular rate, well-perfused, normal S1 and S2 PULM:  CTAB no wheezing or rhonchi GI/GU:  non-distended, non-tender MSK/SPINE:  No gross deformities, no edema SKIN:  no rash, atraumatic   *Additional and/or pertinent findings included in MDM below  Diagnostic and Interventional Summary    EKG Interpretation Date/Time:  Tuesday June 03 2024 19:20:27 EDT Ventricular Rate:  101 PR Interval:  222 QRS Duration:  150 QT Interval:  394 QTC Calculation: 510 R Axis:   124  Text Interpretation: Sinus tachycardia with 1st degree A-V block Right bundle branch block T wave abnormality, consider inferior ischemia Abnormal ECG When compared with ECG of 06-Dec-2021 18:54, PREVIOUS ECG IS PRESENT Confirmed by Theadore Sharper [phone]) on 06/04/2024 12:49:05 AM       Labs Reviewed  BASIC METABOLIC PANEL WITH GFR -  Abnormal; Notable for the following components:      Result Value   Chloride 97 (*)    Glucose, Bld 171 (*)    BUN 28 (*)    All other components within normal limits  CBC - Abnormal; Notable for the following components:   WBC 10.8 (*)    RBC 6.25 (*)    Hemoglobin 17.9 (*)    HCT 53.1 (*)    All other components within normal limits  TROPONIN I (HIGH SENSITIVITY)  TROPONIN I (HIGH SENSITIVITY)    DG Chest 2 View  Final Result      Medications  acetaminophen  (TYLENOL ) tablet 1,000 mg (has no administration in time range)     Procedures  /  Critical Care Procedures  ED Course and Medical Decision Making  Initial Impression and Ddx Suspect viral illness with gastritis causing the vomiting, abdominal and chest discomfort, suspect now dehydrated causing headache.  Well-appearing in no acute distress.  Other considerations include ACS, electrolyte disturbance.  Abdomen soft and nontender, doubt significant intra-abdominal pathology, no indication for advanced imaging.  Past medical/surgical history that increases complexity of ED encounter: Diabetes  Interpretation of Diagnostics I personally reviewed the EKG and my interpretation is as follows: Bundle branch block similar to prior, no significant changes  No significant blood count or electrolyte disturbance.  Troponin negative x 2.  Evidence of hemoconcentration  Patient Reassessment and Ultimate Disposition/Management     No emergent process, appropriate for discharge.  Patient management required discussion with the following services or consulting groups:  None  Complexity of Problems Addressed Acute illness or injury that poses threat of life of bodily function  Additional Data Reviewed and Analyzed Further history obtained from: Further history from spouse/family member  Additional Factors Impacting ED Encounter Risk Prescriptions  Sharper HERO. Theadore, MD [facility]  Health [email or web address]  Final Clinical Impressions(s) / ED Diagnoses     ICD-10-CM   1. Chest pain, unspecified type  R07.9     2. Nausea and vomiting, unspecified vomiting type  R11.2     3. Body aches  R52     4. Dehydration  E86.0       ED Discharge Orders          Ordered    naproxen (NAPROSYN) 500 MG tablet  2 times daily        06/04/24 0047    ondansetron (ZOFRAN-ODT) 4 MG disintegrating tablet  Every 8 hours PRN        06/04/24 0047             Discharge Instructions Discussed with and Provided  to Patient:    Discharge Instructions      You were evaluated in the Emergency Department and after careful evaluation, we did not find any emergent condition requiring admission or further testing in the hospital.  Your exam/testing today is overall reassuring.  Symptoms may be due to a viral illness causing dehydration.  Use the Naprosyn twice daily as needed for body aches or headache.  Use the Zofran as needed for nausea, plenty of fluids and rest.  Please return to the Emergency Department if you experience any worsening of your condition.   Thank you for allowing us  to be a part of your care.      Theadore Ozell HERO, MD 06/04/24 [phone]

## 2024-06-04 NOTE — Discharge Instructions (Signed)
 You were evaluated in the Emergency Department and after careful evaluation, we did not find any emergent condition requiring admission or further testing in the hospital.  Your exam/testing today is overall reassuring.  Symptoms may be due to a viral illness causing dehydration.  Use the Naprosyn twice daily as needed for body aches or headache.  Use the Zofran as needed for nausea, plenty of fluids and rest.  Please return to the Emergency Department if you experience any worsening of your condition.   Thank you for allowing us  to be a part of your care.

## 2024-08-18 DIAGNOSIS — R051 Acute cough: Secondary | ICD-10-CM | POA: Diagnosis not present

## 2024-08-18 DIAGNOSIS — Z20822 Contact with and (suspected) exposure to covid-19: Secondary | ICD-10-CM | POA: Diagnosis not present

## 2024-08-18 DIAGNOSIS — R52 Pain, unspecified: Secondary | ICD-10-CM | POA: Diagnosis not present

## 2024-08-18 DIAGNOSIS — J069 Acute upper respiratory infection, unspecified: Secondary | ICD-10-CM | POA: Diagnosis not present

## 2024-08-18 DIAGNOSIS — J029 Acute pharyngitis, unspecified: Secondary | ICD-10-CM | POA: Diagnosis not present

## 2024-08-19 DIAGNOSIS — Z794 Long term (current) use of insulin: Secondary | ICD-10-CM | POA: Diagnosis not present

## 2024-08-19 DIAGNOSIS — E1169 Type 2 diabetes mellitus with other specified complication: Secondary | ICD-10-CM | POA: Diagnosis not present

## 2024-08-19 DIAGNOSIS — E1140 Type 2 diabetes mellitus with diabetic neuropathy, unspecified: Secondary | ICD-10-CM | POA: Diagnosis not present

## 2024-08-19 DIAGNOSIS — E785 Hyperlipidemia, unspecified: Secondary | ICD-10-CM | POA: Diagnosis not present

## 2024-08-19 DIAGNOSIS — R399 Unspecified symptoms and signs involving the genitourinary system: Secondary | ICD-10-CM | POA: Diagnosis not present

## 2024-08-19 DIAGNOSIS — I10 Essential (primary) hypertension: Secondary | ICD-10-CM | POA: Diagnosis not present

## 2024-08-19 DIAGNOSIS — Z113 Encounter for screening for infections with a predominantly sexual mode of transmission: Secondary | ICD-10-CM | POA: Diagnosis not present

## 2024-08-19 DIAGNOSIS — E1142 Type 2 diabetes mellitus with diabetic polyneuropathy: Secondary | ICD-10-CM | POA: Diagnosis not present

## 2024-08-19 DIAGNOSIS — Z6832 Body mass index (BMI) 32.0-32.9, adult: Secondary | ICD-10-CM | POA: Diagnosis not present

## 2024-08-19 DIAGNOSIS — R062 Wheezing: Secondary | ICD-10-CM | POA: Diagnosis not present

## 2024-08-19 DIAGNOSIS — E669 Obesity, unspecified: Secondary | ICD-10-CM | POA: Diagnosis not present
